# Patient Record
Sex: Female | Born: 2000 | Hispanic: No | Marital: Single | Smoking: Current some day smoker
Health system: Southern US, Community
[De-identification: ages and names within clinical notes are randomized; demographics above are authoritative.]

## PROBLEM LIST (undated history)

## (undated) DIAGNOSIS — Z8673 Personal history of transient ischemic attack (TIA), and cerebral infarction without residual deficits: Secondary | ICD-10-CM

## (undated) DIAGNOSIS — Z23 Encounter for immunization: Secondary | ICD-10-CM

## (undated) HISTORY — PX: NO PAST SURGERIES: SHX2092

## (undated) HISTORY — DX: Personal history of transient ischemic attack (TIA), and cerebral infarction without residual deficits: Z86.73

## (undated) HISTORY — DX: Encounter for immunization: Z23

---
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
## 2021-03-25 ENCOUNTER — Emergency Department (HOSPITAL_COMMUNITY)
Admission: EM | Admit: 2021-03-25 | Discharge: 2021-03-26 | Disposition: A | Discharge status: Home / Self Care | Payer: Managed Care, Other (non HMO) | Attending: Emergency Medicine | Admitting: Emergency Medicine

## 2021-03-25 DIAGNOSIS — Z5321 Procedure and treatment not carried out due to patient leaving prior to being seen by health care provider: Secondary | ICD-10-CM | POA: Insufficient documentation

## 2021-03-25 DIAGNOSIS — T50901A Poisoning by unspecified drugs, medicaments and biological substances, accidental (unintentional), initial encounter: Secondary | ICD-10-CM | POA: Insufficient documentation

## 2021-03-25 NOTE — ED Notes (Signed)
Patient called for triage x6

## 2021-03-27 ENCOUNTER — Other Ambulatory Visit: Payer: Self-pay

## 2021-03-27 ENCOUNTER — Emergency Department (HOSPITAL_COMMUNITY): Payer: Managed Care, Other (non HMO)

## 2021-03-27 ENCOUNTER — Encounter (HOSPITAL_COMMUNITY): Payer: Self-pay | Admitting: Emergency Medicine

## 2021-03-27 ENCOUNTER — Emergency Department (HOSPITAL_COMMUNITY)
Admission: EM | Admit: 2021-03-27 | Discharge: 2021-03-27 | Disposition: A | Discharge status: Home / Self Care | Payer: Managed Care, Other (non HMO) | Source: Home / Self Care | Attending: Emergency Medicine | Admitting: Emergency Medicine

## 2021-03-27 DIAGNOSIS — I676 Nonpyogenic thrombosis of intracranial venous system: Secondary | ICD-10-CM | POA: Insufficient documentation

## 2021-03-27 DIAGNOSIS — G08 Intracranial and intraspinal phlebitis and thrombophlebitis: Secondary | ICD-10-CM

## 2021-03-27 DIAGNOSIS — Z91040 Latex allergy status: Secondary | ICD-10-CM | POA: Insufficient documentation

## 2021-03-27 LAB — COMPREHENSIVE METABOLIC PANEL
ALT: 11 U/L (ref 0–44)
AST: 15 U/L (ref 15–41)
Albumin: 3.7 g/dL (ref 3.5–5.0)
Alkaline Phosphatase: 63 U/L (ref 38–126)
Anion gap: 9 (ref 5–15)
BUN: 6 mg/dL (ref 6–20)
CO2: 22 mmol/L (ref 22–32)
Calcium: 9.1 mg/dL (ref 8.9–10.3)
Chloride: 103 mmol/L (ref 98–111)
Creatinine, Ser: 0.82 mg/dL (ref 0.44–1.00)
GFR, Estimated: >60 mL/min (ref >60)
Glucose, Bld: 95 mg/dL (ref 70–99)
Potassium: 3.9 mmol/L (ref 3.5–5.1)
Sodium: 134 mmol/L — ABNORMAL LOW (ref 135–145)
Total Bilirubin: 0.8 mg/dL (ref 0.3–1.2)
Total Protein: 6.8 g/dL (ref 6.5–8.1)

## 2021-03-27 LAB — CBC WITH DIFFERENTIAL/PLATELET
Abs Immature Granulocytes: 0.02 10*3/uL (ref 0.00–0.07)
Basophils Absolute: 0 10*3/uL (ref 0.0–0.1)
Basophils Relative: 0 %
Eosinophils Absolute: 0 10*3/uL (ref 0.0–0.5)
Eosinophils Relative: 0 %
HCT: 32.9 % — ABNORMAL LOW (ref 36.0–46.0)
Hemoglobin: 9.3 g/dL — ABNORMAL LOW (ref 12.0–15.0)
Immature Granulocytes: 0 %
Lymphocytes Relative: 23 %
Lymphs Abs: 1.5 10*3/uL (ref 0.7–4.0)
MCH: 19.2 pg — ABNORMAL LOW (ref 26.0–34.0)
MCHC: 28.3 g/dL — ABNORMAL LOW (ref 30.0–36.0)
MCV: 68 fL — ABNORMAL LOW (ref 80.0–100.0)
Monocytes Absolute: 0.6 10*3/uL (ref 0.1–1.0)
Monocytes Relative: 9 %
Neutro Abs: 4.5 10*3/uL (ref 1.7–7.7)
Neutrophils Relative %: 68 %
Platelets: 242 10*3/uL (ref 150–400)
RBC: 4.84 MIL/uL (ref 3.87–5.11)
RDW: 17.6 % — ABNORMAL HIGH (ref 11.5–15.5)
WBC: 6.6 10*3/uL (ref 4.0–10.5)
nRBC: 0 % (ref 0.0–0.2)

## 2021-03-27 LAB — I-STAT BETA HCG BLOOD, ED (MC, WL, AP ONLY): I-stat hCG, quantitative: <5 m[IU]/mL (ref <5)

## 2021-03-27 MED ORDER — METOCLOPRAMIDE HCL 5 MG/ML IJ SOLN
10.0000 mg | Freq: Once | INTRAMUSCULAR | Status: AC
  Administered 2021-03-27: 10 mg via INTRAVENOUS
  Filled 2021-03-27: qty 2

## 2021-03-27 MED ORDER — ENOXAPARIN SODIUM 100 MG/ML IJ SOSY
100.0000 mg | PREFILLED_SYRINGE | Freq: Once | INTRAMUSCULAR | Status: AC
  Administered 2021-03-27: 100 mg via SUBCUTANEOUS
  Filled 2021-03-27: qty 1

## 2021-03-27 MED ORDER — SODIUM CHLORIDE 0.9 % IV BOLUS
1000.0000 mL | Freq: Once | INTRAVENOUS | Status: AC
  Administered 2021-03-27: 1000 mL via INTRAVENOUS

## 2021-03-27 MED ORDER — ENOXAPARIN SODIUM 40 MG/0.4ML IJ SOSY: 100.0000 mg | PREFILLED_SYRINGE | INTRAMUSCULAR | 0 refills | Status: DC

## 2021-03-27 MED ORDER — DABIGATRAN ETEXILATE MESYLATE 150 MG PO CAPS: 150.0000 mg | ORAL_CAPSULE | Freq: Two times a day (BID) | ORAL | 0 refills | Status: DC

## 2021-03-27 MED ORDER — DEXAMETHASONE SODIUM PHOSPHATE 10 MG/ML IJ SOLN
10.0000 mg | Freq: Once | INTRAMUSCULAR | Status: AC
  Administered 2021-03-27: 10 mg via INTRAVENOUS
  Filled 2021-03-27: qty 1

## 2021-03-27 MED ORDER — DIPHENHYDRAMINE HCL 50 MG/ML IJ SOLN
25.0000 mg | Freq: Once | INTRAMUSCULAR | Status: AC
  Administered 2021-03-27: 25 mg via INTRAVENOUS
  Filled 2021-03-27: qty 1

## 2021-03-27 MED ORDER — ENOXAPARIN SODIUM 100 MG/ML IJ SOSY: 100.0000 mg | PREFILLED_SYRINGE | INTRAMUSCULAR | 0 refills | Status: DC

## 2021-03-27 MED ORDER — IOHEXOL 350 MG/ML SOLN
75.0000 mL | Freq: Once | INTRAVENOUS | Status: AC | PRN
  Administered 2021-03-27: 75 mL via INTRAVENOUS

## 2021-03-27 MED ORDER — KETOROLAC TROMETHAMINE 15 MG/ML IJ SOLN
15.0000 mg | Freq: Once | INTRAMUSCULAR | Status: AC
  Administered 2021-03-27: 15 mg via INTRAVENOUS
  Filled 2021-03-27: qty 1

## 2021-03-27 NOTE — ED Provider Notes (Signed)
Care of the patient assumed at the change of shift. Discussed management with Dr. Amada Jupiter, Neurology who has reviewed her case. He recommends Lovenox x 5 days and then transition to oral Pradaxa BID thereafter. Patient is mildly anemic, she is unsure if this is new for her. She does not have any current bleeding but reports some days of her menstrual cycle are heavy. Discussed management, follow up and reasons to return with patient and mother at bedside. She will be given a dose of Lovenox in the ED prior to discharge.    Tammy Savoy, MD 03/27/21 1640

## 2021-03-27 NOTE — ED Triage Notes (Signed)
Patient coming from home, complaint of migraine for approx 3 weeks, states nothing makes it any better. VSS. NAD.

## 2021-03-27 NOTE — ED Provider Notes (Signed)
[facility] EMERGENCY DEPARTMENT Provider Note   CSN: [id number] Arrival date & time: 03/27/21  1011     History Chief Complaint  Patient presents with   Migraine    Tammy Weber is a 20 y.o. female.  HPI 20 year old female presents with headache for about 3 weeks.  Waxes and wanes but is always there.  It is a throbbing type headache on the right side.  Occasionally she has had left-sided symptoms.  She has had on and off blurry vision but mostly when she first wakes up.  Vomited once last week but otherwise no vomiting.  No focal weakness or numbness.  No fevers or neck stiffness though she is having a little bit of right-sided neck discomfort over her trapezius over the last couple days.  No dizziness or double vision.  Has been trying ibuprofen with no relief.  Discussed with doctor from [facility] who suggested she come to the ER for a CT scan.  Headache right now is about a 5.  At its worst is benign.  Past Medical History:  Diagnosis Date   Vaccine for human papilloma virus (HPV) types 6, 11, 16, and 18 administered     There are no problems to display for this patient.   Past Surgical History:  Procedure Laterality Date   NO PAST SURGERIES       OB History     Gravida  0   Para  0   Term  0   Preterm  0   AB  0   Living  0      SAB  0   IAB  0   Ectopic  0   Multiple  0   Live Births  0           Family History  Problem Relation Age of Onset   Colon cancer Paternal Aunt    Ovarian cancer Other     Social History   Tobacco Use   Smoking status: Never   Smokeless tobacco: Never  Vaping Use   Vaping Use: Never used  Substance Use Topics   Alcohol use: Yes   Drug use: No    Home Medications Prior to Admission medications   Medication Sig Start Date End Date Taking? Authorizing Provider  Norethindrone Acetate-Ethinyl Estrad-FE (JUNEL FE 24) 1-20 MG-MCG(24) tablet Take 1 tablet by mouth daily. 06/15/20    Copland, Ilona Sorrel, PA-C    Allergies    Latex  Review of Systems   Review of Systems  Constitutional:  Negative for fever.  Eyes:  Positive for photophobia and visual disturbance.  Gastrointestinal:  Positive for vomiting.  Musculoskeletal:  Positive for neck pain. Negative for neck stiffness.  Neurological:  Positive for headaches. Negative for dizziness, weakness and numbness.  All other systems reviewed and are negative.  Physical Exam Updated Vital Signs BP (!) 148/88   Pulse 96   Temp 98.4 F (36.9 C) (Oral)   Resp 20   SpO2 100%   Physical Exam Vitals and nursing note reviewed.  Constitutional:      General: She is not in acute distress.    Appearance: She is well-developed. She is not ill-appearing or diaphoretic.  HENT:     Head: Normocephalic and atraumatic.     Right Ear: External ear normal.     Left Ear: External ear normal.     Nose: Nose normal.  Eyes:     General:  Right eye: No discharge.        Left eye: No discharge.     Extraocular Movements: Extraocular movements intact.     Pupils: Pupils are equal, round, and reactive to light.  Neck:   Cardiovascular:     Rate and Rhythm: Normal rate and regular rhythm.     Heart sounds: Normal heart sounds.  Pulmonary:     Effort: Pulmonary effort is normal.     Breath sounds: Normal breath sounds.  Abdominal:     Palpations: Abdomen is soft.     Tenderness: There is no abdominal tenderness.  Musculoskeletal:     Cervical back: Normal range of motion and neck supple. No rigidity.  Skin:    General: Skin is warm and dry.  Neurological:     Mental Status: She is alert.     Comments: CN 3-12 grossly intact. 5/5 strength in all 4 extremities. Grossly normal sensation. Normal finger to nose.   Psychiatric:        Mood and Affect: Mood is not anxious.    ED Results / Procedures / Treatments   Labs (all labs ordered are listed, but only abnormal results are displayed) Labs Reviewed   COMPREHENSIVE METABOLIC PANEL - Abnormal; Notable for the following components:      Result Value   Sodium 134 (*)    All other components within normal limits  CBC WITH DIFFERENTIAL/PLATELET - Abnormal; Notable for the following components:   Hemoglobin 9.3 (*)    HCT 32.9 (*)    MCV 68.0 (*)    MCH 19.2 (*)    MCHC 28.3 (*)    RDW 17.6 (*)    All other components within normal limits  I-STAT BETA HCG BLOOD, ED ([facility], [facility], [facility] ONLY)    EKG None  Radiology CT VENOGRAM HEAD  Result Date: 03/27/2021 CLINICAL DATA:  Dural venous sinus thrombosis suspected EXAM: CT VENOGRAM HEAD TECHNIQUE: Venographic phase images of the brain were obtained following the administration of intravenous contrast. Multiplanar reformats and maximum intensity projections were generated. CONTRAST:  27mL OMNIPAQUE IOHEXOL 350 MG/ML SOLN COMPARISON:  None. FINDINGS: Brain: There is no evidence of acute intracranial hemorrhage, extra-axial fluid collection, or acute infarct. The ventricles are normal in size. There is no mass lesion. There is no midline shift. Vascular: There is abnormal density in the right transverse sinus on the noncontrast images with occlusive filling defect in the transverse sinus extending to the sigmoid sinus and jugular bulb on the venous phase post-contrast images. The left transverse and sigmoid sinuses are patent. The superior and inferior sagittal sinuses are patent. The internal cerebral veins and basal veins of Rosenthal are patent. The major intracranial arteries are patent. Skull: Normal. Negative for fracture or focal lesion. Sinuses/Orbits: The imaged paranasal sinuses are clear. The imaged globes and orbits are unremarkable. Other: None. 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 These results were called by telephone at the time of interpretation on 03/27/2021 at 2:49 pm to provider Pricilla Loveless , who verbally acknowledged these results.  Electronically Signed   By: Lesia Hausen M.D.   On: 03/27/2021 14:53    Procedures Procedures   Medications Ordered in ED Medications  sodium chloride 0.9 % bolus 1,000 mL (1,000 mLs Intravenous New Bag/Given 03/27/21 1414)  metoCLOPramide (REGLAN) injection 10 mg (10 mg Intravenous Given 03/27/21 1414)  diphenhydrAMINE (BENADRYL) injection 25 mg (25 mg Intravenous Given 03/27/21 1414)  ketorolac (TORADOL) 15 MG/ML injection 15  mg (15 mg Intravenous Given 03/27/21 1414)  dexamethasone (DECADRON) injection 10 mg (10 mg Intravenous Given 03/27/21 1413)  iohexol (OMNIPAQUE) 350 MG/ML injection 75 mL (75 mLs Intravenous Contrast Given 03/27/21 1441)    ED Course  I have reviewed the triage vital signs and the nursing notes.  Pertinent labs & imaging results that were available during my care of the patient were reviewed by me and considered in my medical decision making (see chart for details).    MD[facility] Rules/Calculators/A&P                           Given unilateral pain, vague eye symptoms, and her being on birth control, CTV obtained to rule out venous thrombosis.  This is found to be positive which would explain her symptoms.  Her headache is gone after treatment in the ED.  I discussed with Dr. Amada Jupiter, and he is not quite sure right now if she should be in the hospital versus home with anticoagulation.  He will consult and make a formal recommendation.  Care to Dr. Bernette Mayers.  Patient made aware of these findings. Final Clinical Impression(s) / ED Diagnoses Final diagnoses:  Cerebral venous thrombosis    Rx / DC Orders ED Discharge Orders     None        Pricilla Loveless, MD 03/27/21 1539

## 2021-03-29 ENCOUNTER — Inpatient Hospital Stay (HOSPITAL_COMMUNITY)
Admission: EM | Admit: 2021-03-29 | Discharge: 2021-03-31 | DRG: 093 | Disposition: A | Discharge status: Home / Self Care | Payer: Managed Care, Other (non HMO) | Attending: Family Medicine | Admitting: Family Medicine

## 2021-03-29 ENCOUNTER — Encounter (HOSPITAL_COMMUNITY): Payer: Self-pay | Admitting: Emergency Medicine

## 2021-03-29 ENCOUNTER — Emergency Department (HOSPITAL_COMMUNITY): Payer: Managed Care, Other (non HMO)

## 2021-03-29 ENCOUNTER — Other Ambulatory Visit: Payer: Self-pay

## 2021-03-29 DIAGNOSIS — G08 Intracranial and intraspinal phlebitis and thrombophlebitis: Secondary | ICD-10-CM

## 2021-03-29 DIAGNOSIS — I676 Nonpyogenic thrombosis of intracranial venous system: Principal | ICD-10-CM | POA: Diagnosis present

## 2021-03-29 DIAGNOSIS — H918X3 Other specified hearing loss, bilateral: Secondary | ICD-10-CM | POA: Diagnosis present

## 2021-03-29 DIAGNOSIS — D509 Iron deficiency anemia, unspecified: Secondary | ICD-10-CM | POA: Diagnosis present

## 2021-03-29 DIAGNOSIS — R519 Headache, unspecified: Secondary | ICD-10-CM | POA: Diagnosis present

## 2021-03-29 DIAGNOSIS — D649 Anemia, unspecified: Secondary | ICD-10-CM | POA: Diagnosis present

## 2021-03-29 DIAGNOSIS — T384X5A Adverse effect of oral contraceptives, initial encounter: Secondary | ICD-10-CM | POA: Diagnosis present

## 2021-03-29 DIAGNOSIS — H538 Other visual disturbances: Secondary | ICD-10-CM | POA: Diagnosis present

## 2021-03-29 DIAGNOSIS — Z20822 Contact with and (suspected) exposure to covid-19: Secondary | ICD-10-CM | POA: Diagnosis present

## 2021-03-29 DIAGNOSIS — E538 Deficiency of other specified B group vitamins: Secondary | ICD-10-CM | POA: Diagnosis present

## 2021-03-29 LAB — COMPREHENSIVE METABOLIC PANEL
ALT: 12 U/L (ref 0–44)
AST: 16 U/L (ref 15–41)
Albumin: 3.9 g/dL (ref 3.5–5.0)
Alkaline Phosphatase: 58 U/L (ref 38–126)
Anion gap: 10 (ref 5–15)
BUN: 7 mg/dL (ref 6–20)
CO2: 24 mmol/L (ref 22–32)
Calcium: 9.5 mg/dL (ref 8.9–10.3)
Chloride: 104 mmol/L (ref 98–111)
Creatinine, Ser: 0.91 mg/dL (ref 0.44–1.00)
GFR, Estimated: >60 mL/min (ref >60)
Glucose, Bld: 99 mg/dL (ref 70–99)
Potassium: 4.1 mmol/L (ref 3.5–5.1)
Sodium: 138 mmol/L (ref 135–145)
Total Bilirubin: 0.2 mg/dL — ABNORMAL LOW (ref 0.3–1.2)
Total Protein: 7.5 g/dL (ref 6.5–8.1)

## 2021-03-29 LAB — CBC WITH DIFFERENTIAL/PLATELET
Abs Immature Granulocytes: 0 10*3/uL (ref 0.00–0.07)
Basophils Absolute: 0.1 10*3/uL (ref 0.0–0.1)
Basophils Relative: 1 %
Eosinophils Absolute: 0.1 10*3/uL (ref 0.0–0.5)
Eosinophils Relative: 1 %
HCT: 35.3 % — ABNORMAL LOW (ref 36.0–46.0)
Hemoglobin: 9.8 g/dL — ABNORMAL LOW (ref 12.0–15.0)
Lymphocytes Relative: 38 %
Lymphs Abs: 2.7 10*3/uL (ref 0.7–4.0)
MCH: 19.2 pg — ABNORMAL LOW (ref 26.0–34.0)
MCHC: 27.8 g/dL — ABNORMAL LOW (ref 30.0–36.0)
MCV: 69.2 fL — ABNORMAL LOW (ref 80.0–100.0)
Monocytes Absolute: 0.1 10*3/uL (ref 0.1–1.0)
Monocytes Relative: 2 %
Neutro Abs: 4.2 10*3/uL (ref 1.7–7.7)
Neutrophils Relative %: 58 %
Platelets: 298 10*3/uL (ref 150–400)
RBC: 5.1 MIL/uL (ref 3.87–5.11)
RDW: 18.3 % — ABNORMAL HIGH (ref 11.5–15.5)
WBC: 7.2 10*3/uL (ref 4.0–10.5)
nRBC: 0 % (ref 0.0–0.2)
nRBC: 0 /100 WBC

## 2021-03-29 LAB — HEPARIN ANTI-XA: Heparin LMW: 1.07 IU/mL

## 2021-03-29 IMAGING — MR MR MRV HEAD WO/W CM
4 of 5 series · 33 of 48 positions shown · IV contrast (Gadavist)
Comparison: Same day CTV
COMPARISON: Same day CTV

Addendum:
CLINICAL DATA: Headache, new or worsening, neuro deficit (Age
19-49y)

EXAM:
MRI HEAD WITHOUT AND WITH CONTRAST
MRV HEAD WITHOUT AND WITH CONTRAST
TECHNIQUE: Multiplanar, multi-echo pulse sequences of the brain and surrounding
structures were acquired without and with intravenous contrast.
Angiographic images of the intracranial venous structures were
acquired using MRV technique without and with intravenous contrast.
CONTRAST:  CONTRAST
7.5 ML OF GADAVIST IV.

[Series 5: venous inhance coronal · coronal · portal-venous · 0.9mm · 0.57mm/px · 9 of 192 slices shown]
[im 1/192]
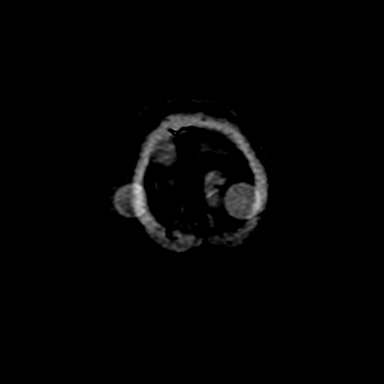
[im 32/192]
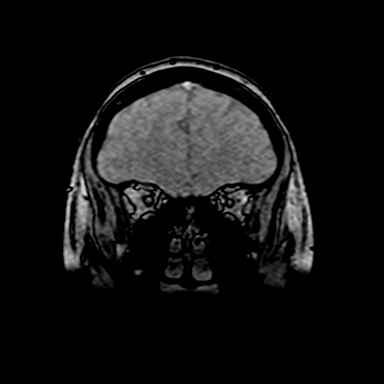
[im 64/192]
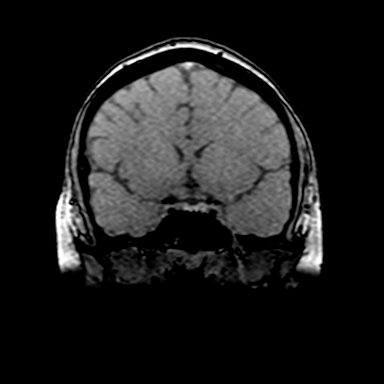
[im 80/192]
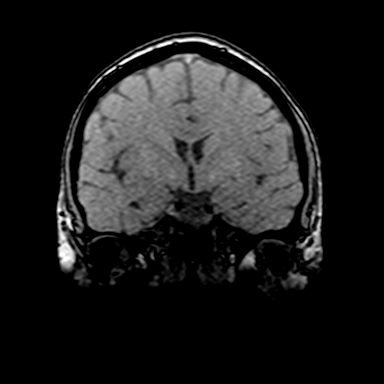
[im 96/192]
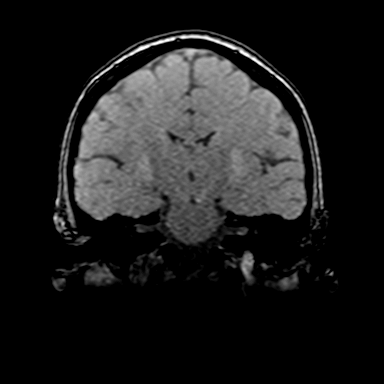
[im 112/192]
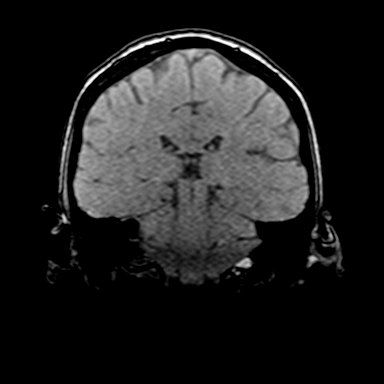
[im 128/192]
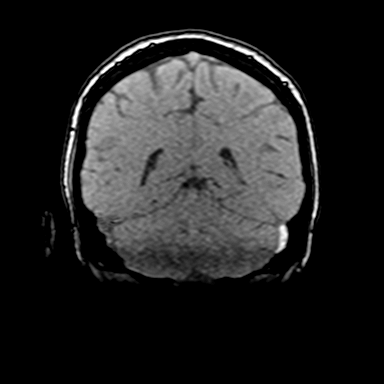
[im 160/192]
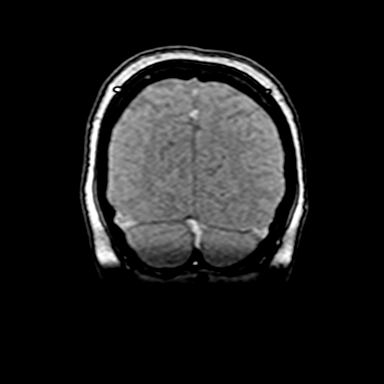
[im 192/192]
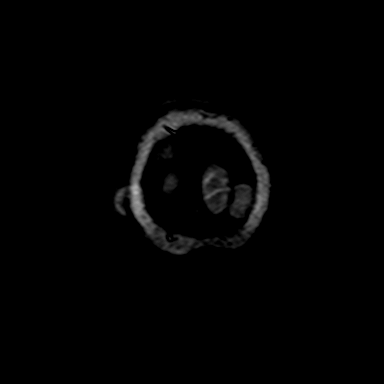

[Series 6: venous inhance coronal_msum · coronal · portal-venous · 0.9mm · 0.57mm/px · 9 of 192 slices shown]
[im 1/192]
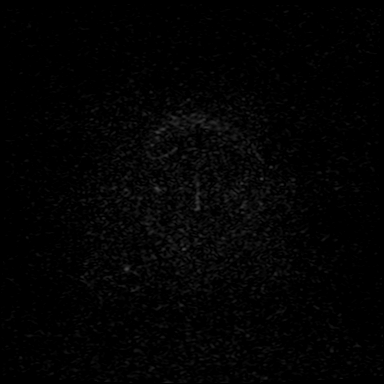
[im 32/192]
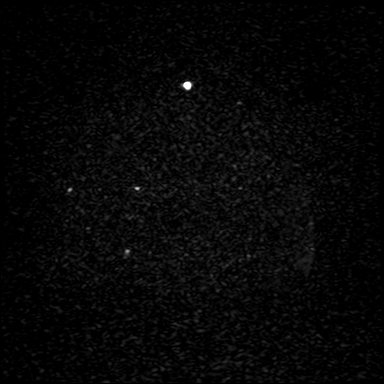
[im 64/192]
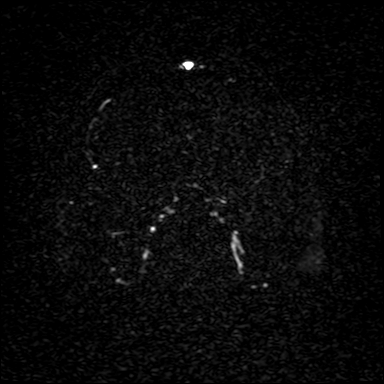
[im 80/192]
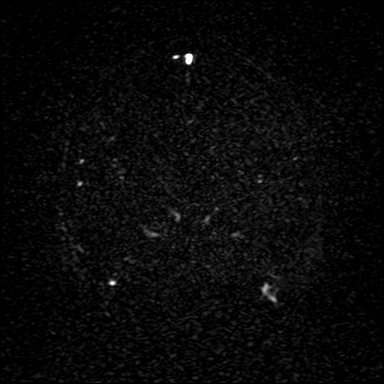
[im 96/192]
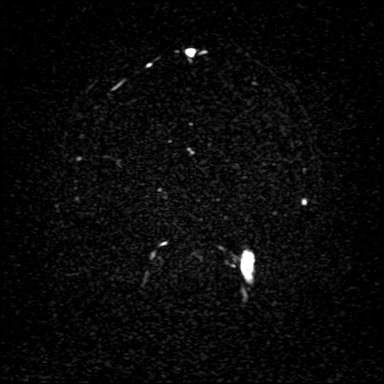
[im 112/192]
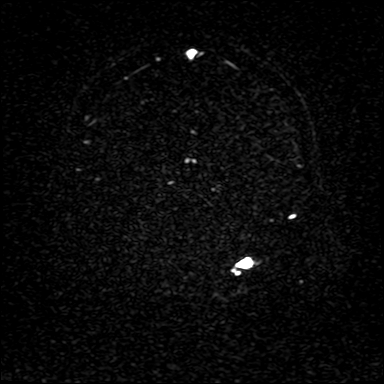
[im 128/192]
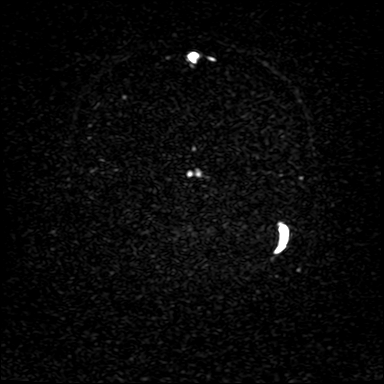
[im 160/192]
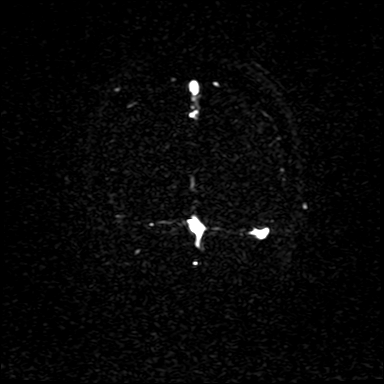
[im 192/192]
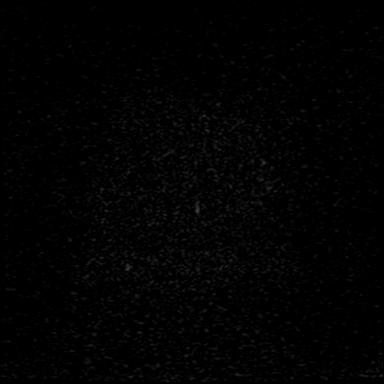

[Series 9: t1_fl3d_sag_p2_iso · sagittal · 1.0mm · 0.98mm/px · 11 of 160 slices shown]
[im 1/160]
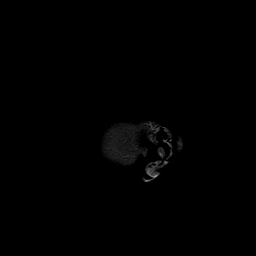
[im 16/160]
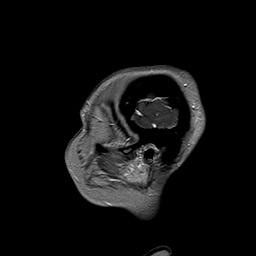
[im 32/160]
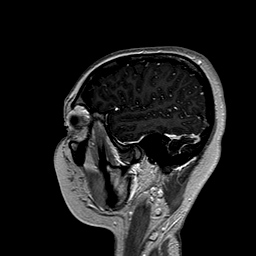
[im 48/160]
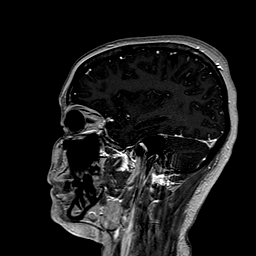
[im 64/160]
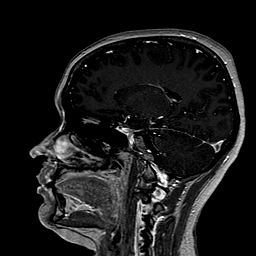
[im 80/160]
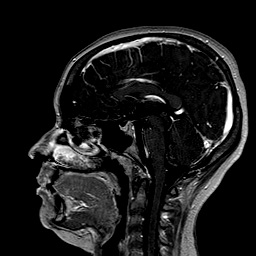
[im 96/160]
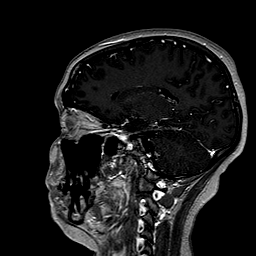
[im 112/160]
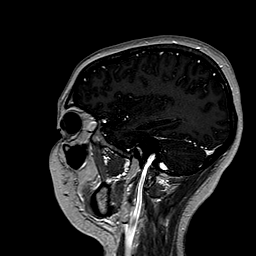
[im 128/160]
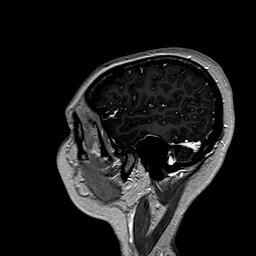
[im 144/160]
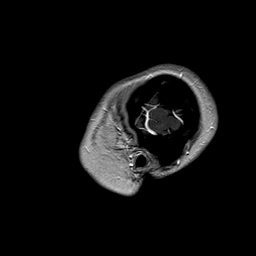
[im 160/160]
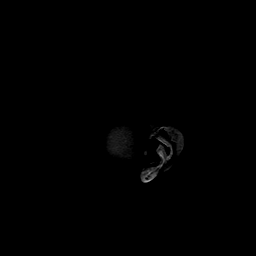

[Series 10: t1_fl3d_sag_p2_iso_mpr_ axial · axial · 2.0mm · 0.45mm/px · z∈[-98,+53]mm · 4 of 77 slices shown]
[im 1/77]
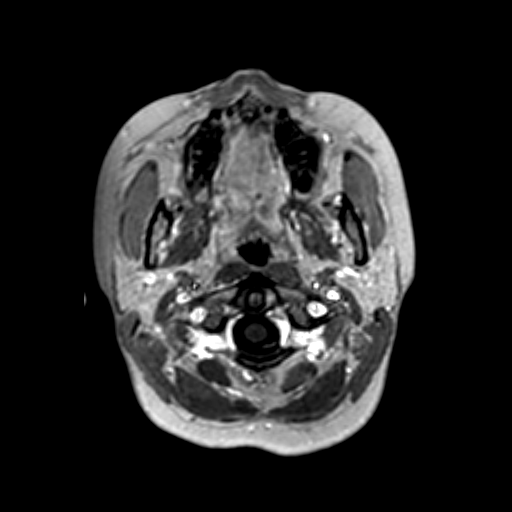
[im 20/77]
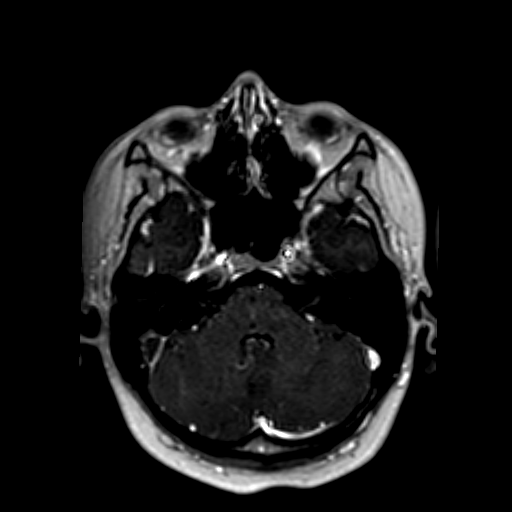
[im 39/77]
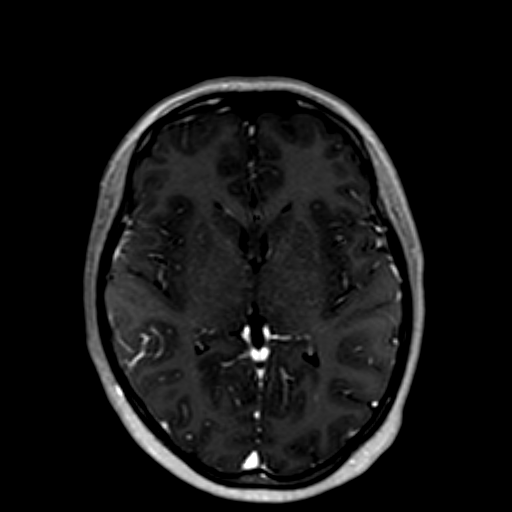
[im 77/77]
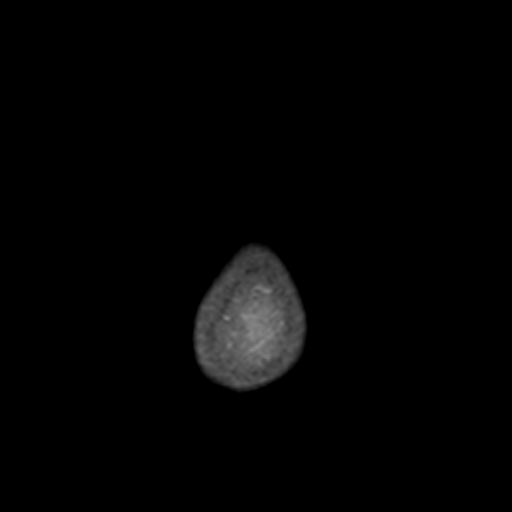

[33 of 48 positions shown; findings below may reference images not displayed]

FINDINGS: MRI HEAD WITHOUT CONTRAST

Brain: No acute infarction, hemorrhage, hydrocephalus, extra-axial
collection or mass lesion. No brain edema. No abnormal enhancement.

Vascular: Major arterial flow voids are maintained at the skull
base. Dural venous sinuses are assess below.

Skull and upper cervical spine: Normal marrow signal.

Sinuses/Orbits: Clear paranasal sinuses.  Unremarkable orbits.

Other: Small right mastoid effusion.

MR VENOGRAM WITHOUT CONTRAST

Positive for occlusive thrombus within the right transverse and
sigmoid sinuses. Thrombus extends into the right jugular bulb and
upper internal jugular vein. Some thrombus extends into adjacent
draining cortical veins. The superior sagittal sinus, left
transverse sinus, left sigmoid sinus and left jugular bulb are
patent. The straight sinus and visualized deep cerebral veins are
patent. Symmetric opacification of the cavernous sinuses.
IMPRESSION: 1. Positive for occlusive thrombus within the right transverse and
sigmoid sinuses. Thrombus extends into the right jugular bulb and
upper internal jugular vein. No evidence of brain edema or venous
infarct.
2. Small right mastoid effusion.

ADDENDUM:
Correction to the comparison: The prior CTV was on [DATE], not
the same day. While comparison across modalities is difficult, the
extent of thrombus may be slightly progressed in comparison to that
study.

Findings in the report and this addendum discussed with Dr. SAN NICOLAS via
telephone at [DATE] p.m.

*** End of Addendum ***
FINDINGS: MRI HEAD WITHOUT CONTRAST

Brain: No acute infarction, hemorrhage, hydrocephalus, extra-axial
collection or mass lesion. No brain edema. No abnormal enhancement.

Vascular: Major arterial flow voids are maintained at the skull
base. Dural venous sinuses are assess below.

Skull and upper cervical spine: Normal marrow signal.

Sinuses/Orbits: Clear paranasal sinuses.  Unremarkable orbits.

Other: Small right mastoid effusion.

MR VENOGRAM WITHOUT CONTRAST

Positive for occlusive thrombus within the right transverse and
sigmoid sinuses. Thrombus extends into the right jugular bulb and
upper internal jugular vein. Some thrombus extends into adjacent
draining cortical veins. The superior sagittal sinus, left
transverse sinus, left sigmoid sinus and left jugular bulb are
patent. The straight sinus and visualized deep cerebral veins are
patent. Symmetric opacification of the cavernous sinuses.
IMPRESSION: 1. Positive for occlusive thrombus within the right transverse and
sigmoid sinuses. Thrombus extends into the right jugular bulb and
upper internal jugular vein. No evidence of brain edema or venous
infarct.
2. Small right mastoid effusion.

## 2021-03-29 IMAGING — MR MR MRV HEAD WO/W CM
1 series · 48 of 48 positions shown · IV contrast (gadavist)
Comparison: Same day CTV
COMPARISON: Same day CTV

Addendum:
CLINICAL DATA: Headache, new or worsening, neuro deficit (Age
19-49y)

EXAM:
MRI HEAD WITHOUT AND WITH CONTRAST
MRV HEAD WITHOUT AND WITH CONTRAST
TECHNIQUE: Multiplanar, multi-echo pulse sequences of the brain and surrounding
structures were acquired without and with intravenous contrast.
Angiographic images of the intracranial venous structures were
acquired using MRV technique without and with intravenous contrast.
CONTRAST:  CONTRAST
7.5 ML OF GADAVIST IV.

[Series 18: tof_fl2d_paracor · coronal · 2.0mm · 0.98mm/px · 48 of 127 slices shown]
[im 1/127]
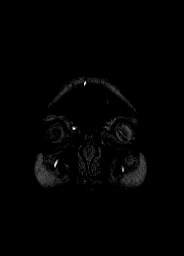
[im 3/127]
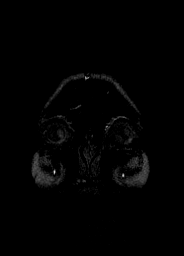
[im 6/127]
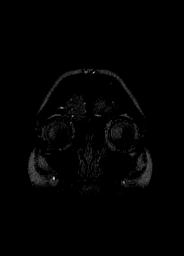
[im 9/127]
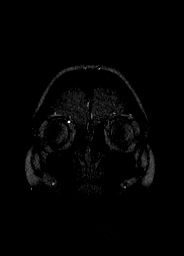
[im 11/127]
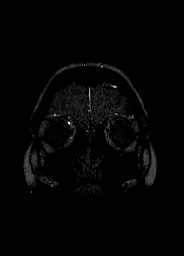
[im 14/127]
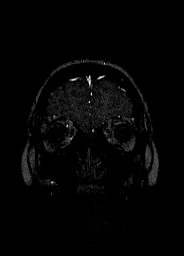
[im 17/127]
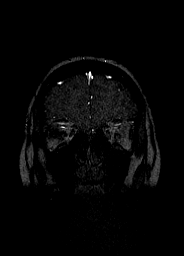
[im 19/127]
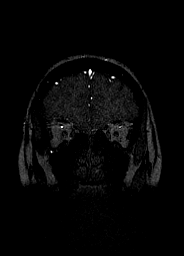
[im 22/127]
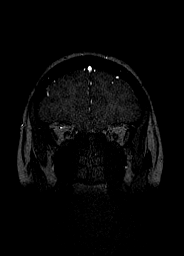
[im 25/127]
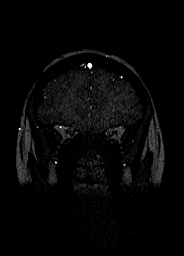
[im 27/127]
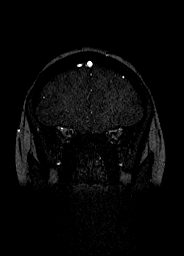
[im 30/127]
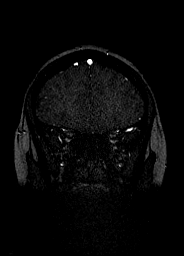
[im 33/127]
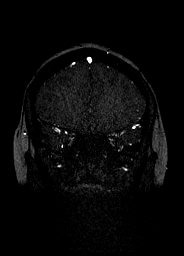
[im 35/127]
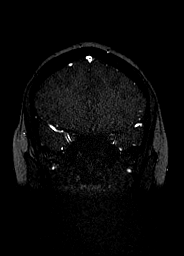
[im 38/127]
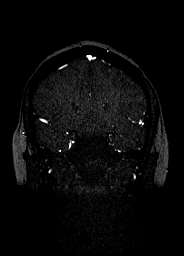
[im 41/127]
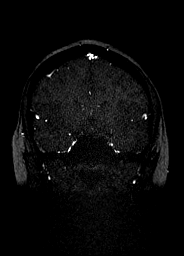
[im 43/127]
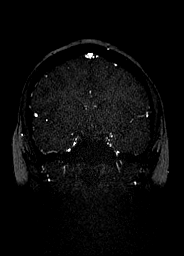
[im 46/127]
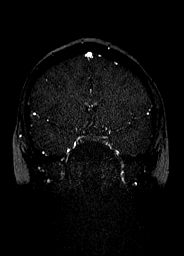
[im 49/127]
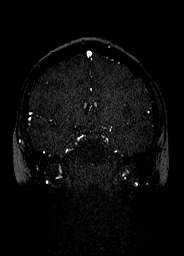
[im 51/127]
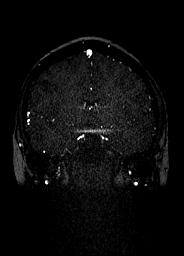
[im 54/127]
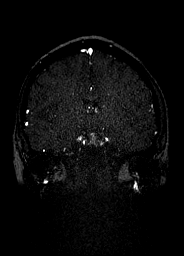
[im 57/127]
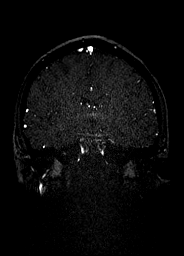
[im 59/127]
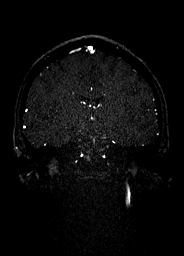
[im 62/127]
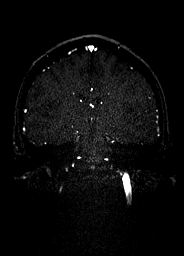
[im 65/127]
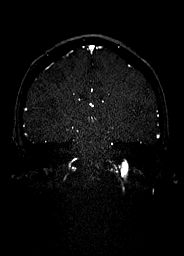
[im 68/127]
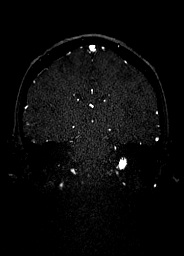
[im 70/127]
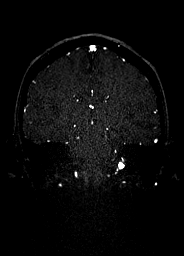
[im 73/127]
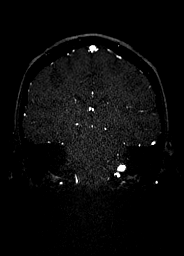
[im 76/127]
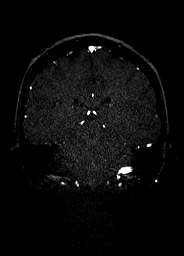
[im 78/127]
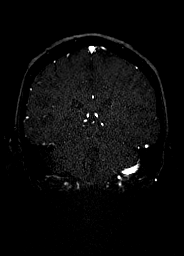
[im 81/127]
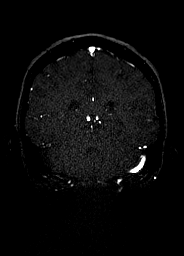
[im 84/127]
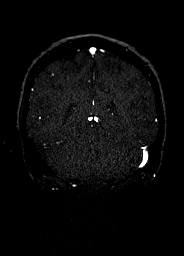
[im 86/127]
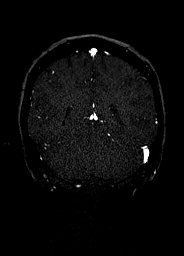
[im 89/127]
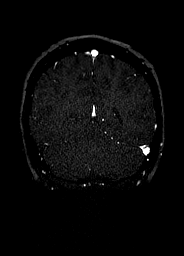
[im 92/127]
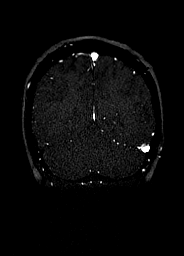
[im 94/127]
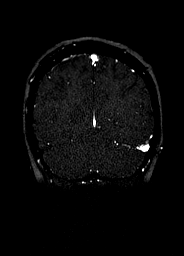
[im 97/127]
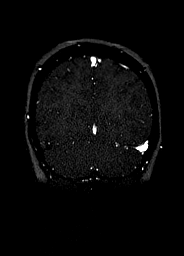
[im 100/127]
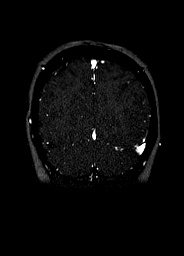
[im 102/127]
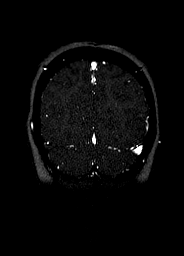
[im 105/127]
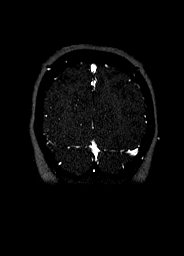
[im 108/127]
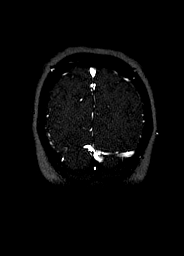
[im 110/127]
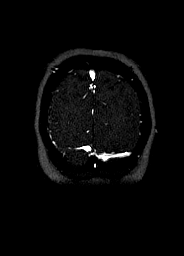
[im 113/127]
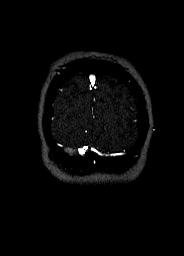
[im 116/127]
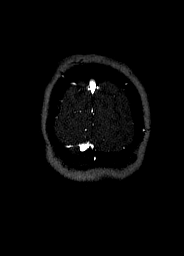
[im 118/127]
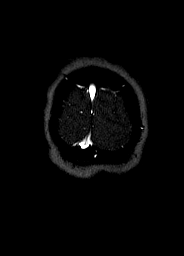
[im 121/127]
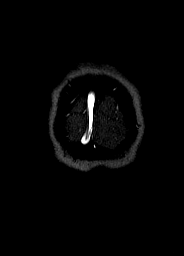
[im 124/127]
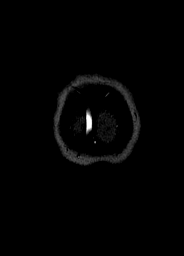
[im 127/127]
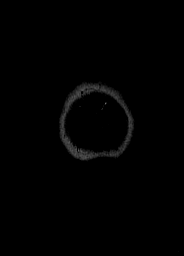

[48 of 48 positions shown; findings below may reference images not displayed]

FINDINGS: MRI HEAD WITHOUT CONTRAST

Brain: No acute infarction, hemorrhage, hydrocephalus, extra-axial
collection or mass lesion. No brain edema. No abnormal enhancement.

Vascular: Major arterial flow voids are maintained at the skull
base. Dural venous sinuses are assess below.

Skull and upper cervical spine: Normal marrow signal.

Sinuses/Orbits: Clear paranasal sinuses.  Unremarkable orbits.

Other: Small right mastoid effusion.

MR VENOGRAM WITHOUT CONTRAST

Positive for occlusive thrombus within the right transverse and
sigmoid sinuses. Thrombus extends into the right jugular bulb and
upper internal jugular vein. Some thrombus extends into adjacent
draining cortical veins. The superior sagittal sinus, left
transverse sinus, left sigmoid sinus and left jugular bulb are
patent. The straight sinus and visualized deep cerebral veins are
patent. Symmetric opacification of the cavernous sinuses.
IMPRESSION: 1. Positive for occlusive thrombus within the right transverse and
sigmoid sinuses. Thrombus extends into the right jugular bulb and
upper internal jugular vein. No evidence of brain edema or venous
infarct.
2. Small right mastoid effusion.

ADDENDUM:
Correction to the comparison: The prior CTV was on [DATE], not
the same day. While comparison across modalities is difficult, the
extent of thrombus may be slightly progressed in comparison to that
study.

Findings in the report and this addendum discussed with Dr. SAN NICOLAS via
telephone at [DATE] p.m.

*** End of Addendum ***
FINDINGS: MRI HEAD WITHOUT CONTRAST

Brain: No acute infarction, hemorrhage, hydrocephalus, extra-axial
collection or mass lesion. No brain edema. No abnormal enhancement.

Vascular: Major arterial flow voids are maintained at the skull
base. Dural venous sinuses are assess below.

Skull and upper cervical spine: Normal marrow signal.

Sinuses/Orbits: Clear paranasal sinuses.  Unremarkable orbits.

Other: Small right mastoid effusion.

MR VENOGRAM WITHOUT CONTRAST

Positive for occlusive thrombus within the right transverse and
sigmoid sinuses. Thrombus extends into the right jugular bulb and
upper internal jugular vein. Some thrombus extends into adjacent
draining cortical veins. The superior sagittal sinus, left
transverse sinus, left sigmoid sinus and left jugular bulb are
patent. The straight sinus and visualized deep cerebral veins are
patent. Symmetric opacification of the cavernous sinuses.
IMPRESSION: 1. Positive for occlusive thrombus within the right transverse and
sigmoid sinuses. Thrombus extends into the right jugular bulb and
upper internal jugular vein. No evidence of brain edema or venous
infarct.
2. Small right mastoid effusion.

## 2021-03-29 IMAGING — MR MR HEAD W/O CM
12 of 13 series · 44 of 48 positions shown · IV contrast (gadavist)
Comparison: Same day CTV
COMPARISON: Same day CTV

Addendum:
CLINICAL DATA: Headache, new or worsening, neuro deficit (Age
19-49y)

EXAM:
MRI HEAD WITHOUT AND WITH CONTRAST
MRV HEAD WITHOUT AND WITH CONTRAST
TECHNIQUE: Multiplanar, multi-echo pulse sequences of the brain and surrounding
structures were acquired without and with intravenous contrast.
Angiographic images of the intracranial venous structures were
acquired using MRV technique without and with intravenous contrast.
CONTRAST:  CONTRAST
7.5 ML OF GADAVIST IV.

[Series 5: DWI · axial · 3.0mm · 0.88mm/px · z∈[-107,+41]mm · 8 of 102 slices shown (1 of 4)]
[im 1/102]
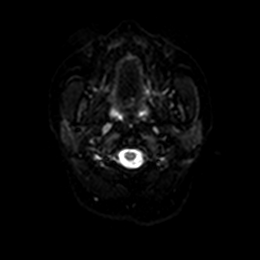
[im 15/102]
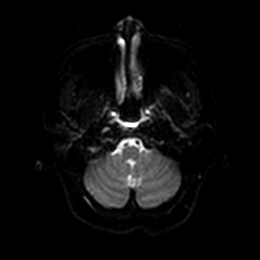
[im 29/102]
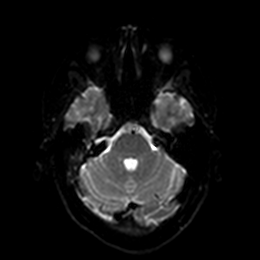
[im 44/102]
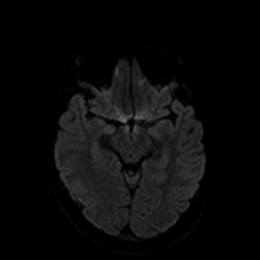
[im 58/102]
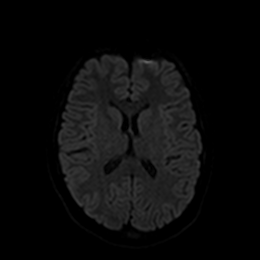
[im 73/102]
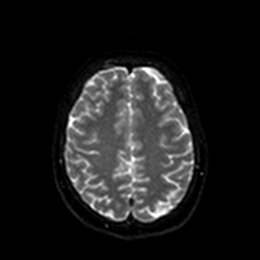
[im 87/102]
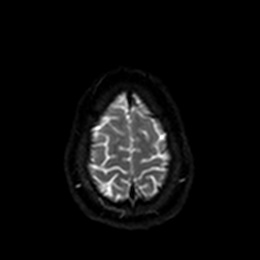
[im 102/102]
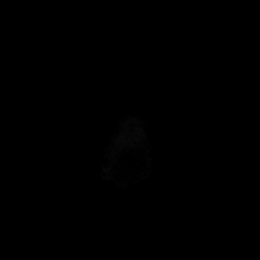

[Series 6: DWI · axial · 3.0mm · 0.88mm/px · z∈[-107,+41]mm · 4 of 51 slices shown (2 of 4)]
[im 1/51]
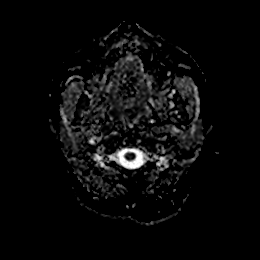
[im 17/51]
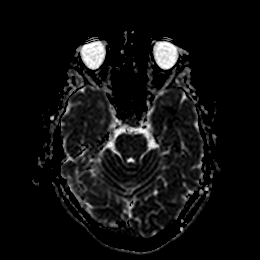
[im 34/51]
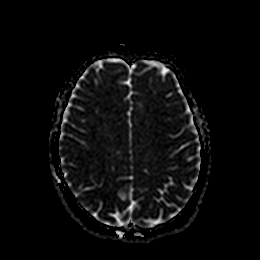
[im 51/51]
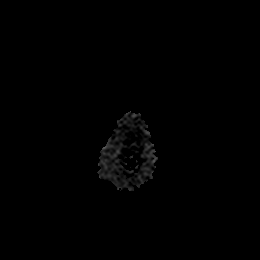

[Series 7: DWI · coronal · 4.0mm · 0.88mm/px · 5 of 72 slices shown (3 of 4)]
[im 1/72]
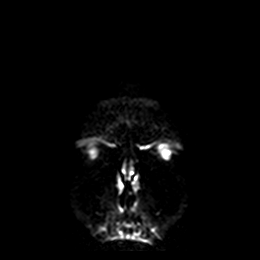
[im 18/72]
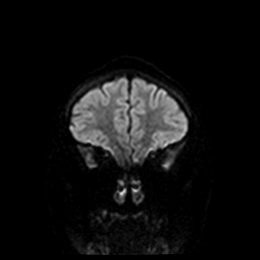
[im 36/72]
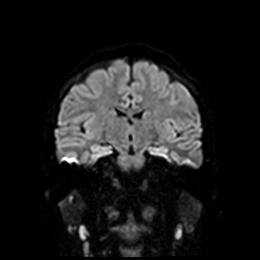
[im 54/72]
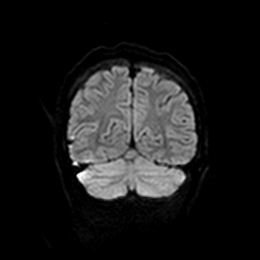
[im 72/72]
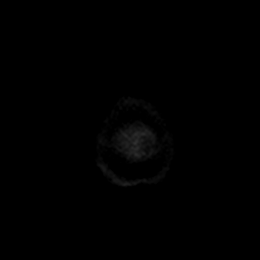

[Series 8: DWI · coronal · 4.0mm · 0.88mm/px · 3 of 36 slices shown (4 of 4)]
[im 1/36]
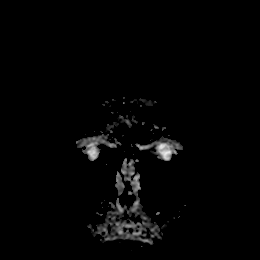
[im 18/36]
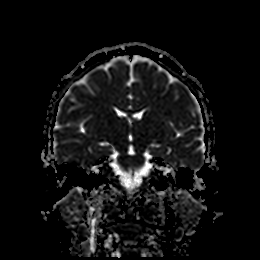
[im 36/36]
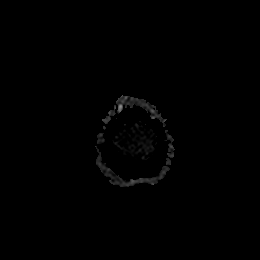

[Series 9: T1 · sagittal · 5.0mm · 0.75mm/px · 2 of 23 slices shown]
[im 1/23]
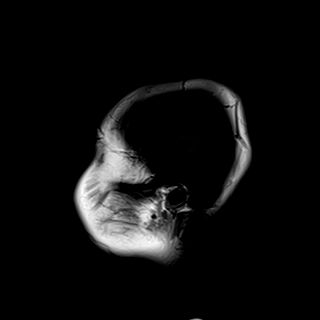
[im 23/23]
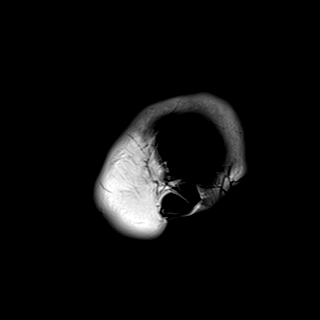

[Series 10: T2 · axial · 5.0mm · 0.72mm/px · z∈[-104,+38]mm · 2 of 25 slices shown (1 of 2)]
[im 1/25]
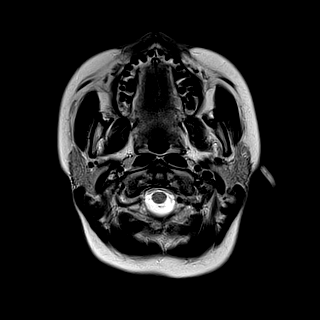
[im 25/25]
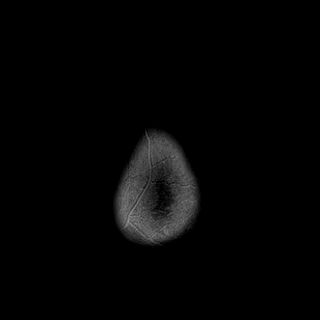

[Series 11: FLAIR · axial · 5.0mm · 0.45mm/px · z∈[-104,+38]mm · 2 of 25 slices shown]
[im 1/25]
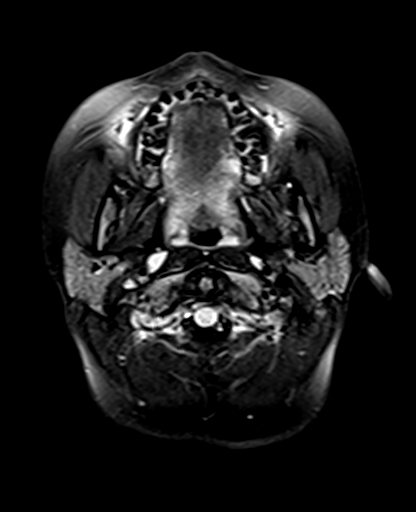
[im 25/25]
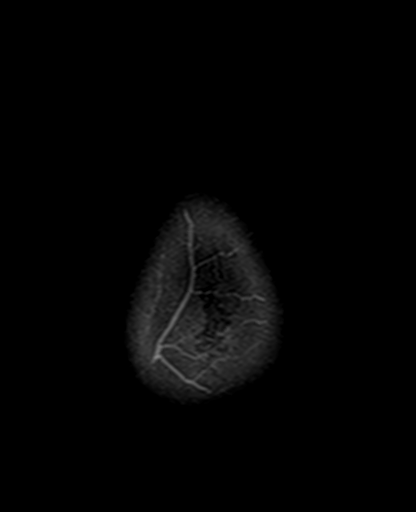

[Series 12: mag_images · axial · 3.0mm · 0.90mm/px · z∈[-114,+48]mm · 4 of 56 slices shown]
[im 1/56]
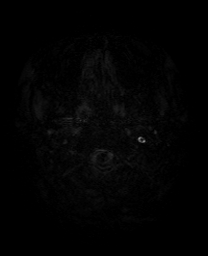
[im 19/56]
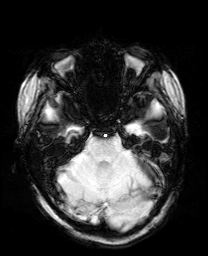
[im 37/56]
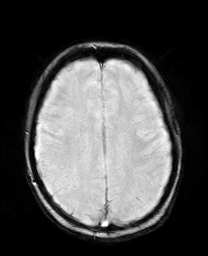
[im 56/56]
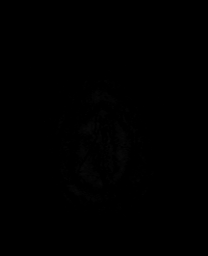

[Series 13: pha_images · axial · 3.0mm · 0.90mm/px · z∈[-114,+48]mm · 4 of 56 slices shown]
[im 1/56]
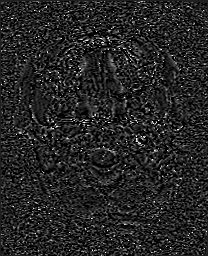
[im 19/56]
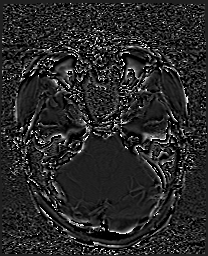
[im 37/56]
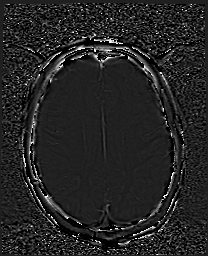
[im 56/56]
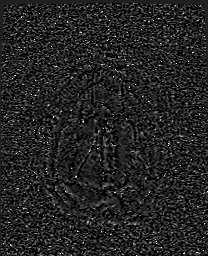

[Series 14: swi_images · axial · 3.0mm · 0.90mm/px · z∈[-114,+48]mm · 4 of 56 slices shown]
[im 1/56]
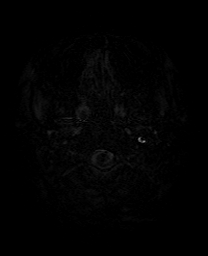
[im 19/56]
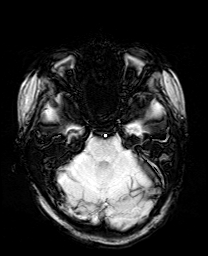
[im 37/56]
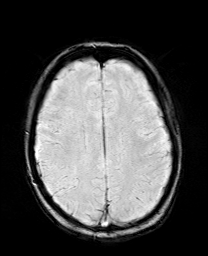
[im 56/56]
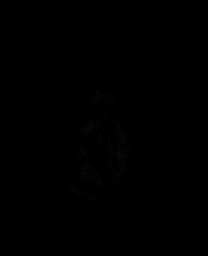

[Series 15: mip_images(sw) · axial · 24.0mm · 0.90mm/px · z∈[-104,+38]mm · 4 of 49 slices shown]
[im 1/49]
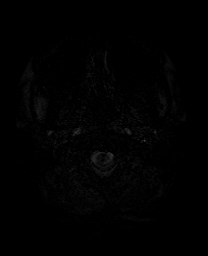
[im 17/49]
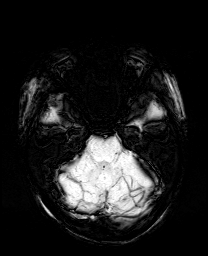
[im 33/49]
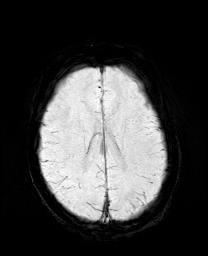
[im 49/49]
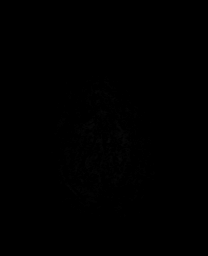

[Series 17: T2 · coronal · 5.0mm · 0.34mm/px · 2 of 29 slices shown (2 of 2)]
[im 1/29]
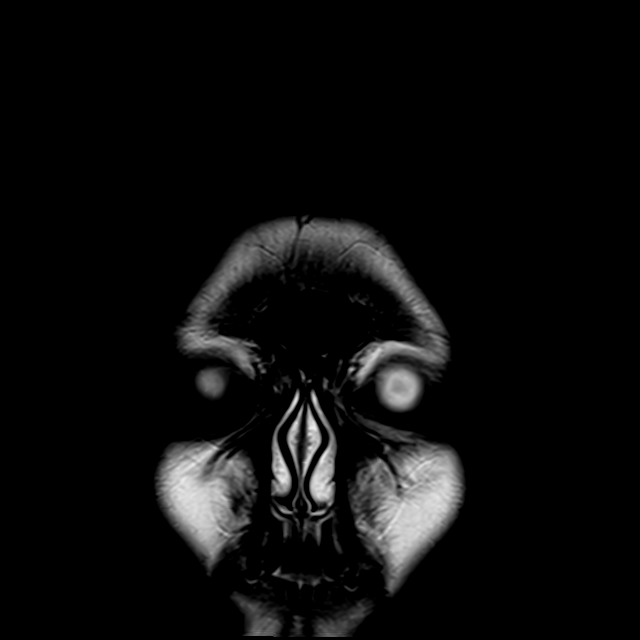
[im 29/29]
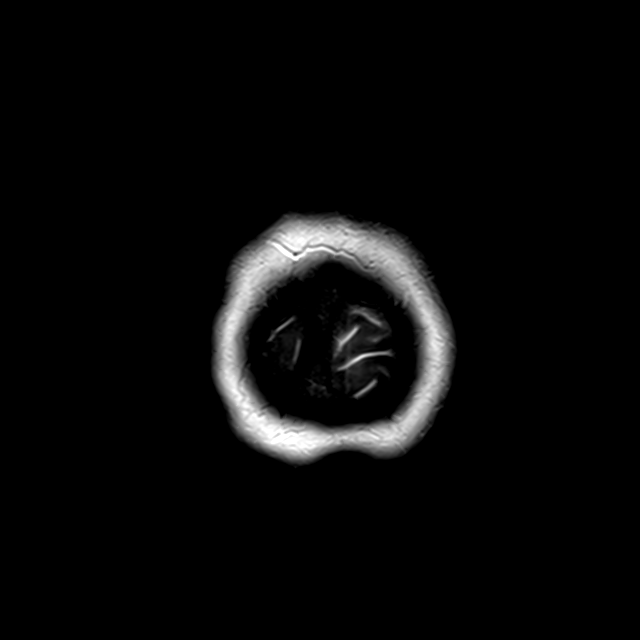

[44 of 48 positions shown; findings below may reference images not displayed]

FINDINGS: MRI HEAD WITHOUT CONTRAST

Brain: No acute infarction, hemorrhage, hydrocephalus, extra-axial
collection or mass lesion. No brain edema. No abnormal enhancement.

Vascular: Major arterial flow voids are maintained at the skull
base. Dural venous sinuses are assess below.

Skull and upper cervical spine: Normal marrow signal.

Sinuses/Orbits: Clear paranasal sinuses.  Unremarkable orbits.

Other: Small right mastoid effusion.

MR VENOGRAM WITHOUT CONTRAST

Positive for occlusive thrombus within the right transverse and
sigmoid sinuses. Thrombus extends into the right jugular bulb and
upper internal jugular vein. Some thrombus extends into adjacent
draining cortical veins. The superior sagittal sinus, left
transverse sinus, left sigmoid sinus and left jugular bulb are
patent. The straight sinus and visualized deep cerebral veins are
patent. Symmetric opacification of the cavernous sinuses.
IMPRESSION: 1. Positive for occlusive thrombus within the right transverse and
sigmoid sinuses. Thrombus extends into the right jugular bulb and
upper internal jugular vein. No evidence of brain edema or venous
infarct.
2. Small right mastoid effusion.

ADDENDUM:
Correction to the comparison: The prior CTV was on [DATE], not
the same day. While comparison across modalities is difficult, the
extent of thrombus may be slightly progressed in comparison to that
study.

Findings in the report and this addendum discussed with Dr. SAN NICOLAS via
telephone at [DATE] p.m.

*** End of Addendum ***
FINDINGS: MRI HEAD WITHOUT CONTRAST

Brain: No acute infarction, hemorrhage, hydrocephalus, extra-axial
collection or mass lesion. No brain edema. No abnormal enhancement.

Vascular: Major arterial flow voids are maintained at the skull
base. Dural venous sinuses are assess below.

Skull and upper cervical spine: Normal marrow signal.

Sinuses/Orbits: Clear paranasal sinuses.  Unremarkable orbits.

Other: Small right mastoid effusion.

MR VENOGRAM WITHOUT CONTRAST

Positive for occlusive thrombus within the right transverse and
sigmoid sinuses. Thrombus extends into the right jugular bulb and
upper internal jugular vein. Some thrombus extends into adjacent
draining cortical veins. The superior sagittal sinus, left
transverse sinus, left sigmoid sinus and left jugular bulb are
patent. The straight sinus and visualized deep cerebral veins are
patent. Symmetric opacification of the cavernous sinuses.
IMPRESSION: 1. Positive for occlusive thrombus within the right transverse and
sigmoid sinuses. Thrombus extends into the right jugular bulb and
upper internal jugular vein. No evidence of brain edema or venous
infarct.
2. Small right mastoid effusion.

## 2021-03-29 MED ORDER — SODIUM CHLORIDE 0.9 % IV BOLUS
500.0000 mL | Freq: Once | INTRAVENOUS | Status: AC
Start: 2021-03-29 — End: 2021-03-30
  Administered 2021-03-29: 500 mL via INTRAVENOUS

## 2021-03-29 MED ORDER — GADOBUTROL 1 MMOL/ML IV SOLN
7.5000 mL | Freq: Once | INTRAVENOUS | Status: AC | PRN
  Administered 2021-03-29: 7.5 mL via INTRAVENOUS

## 2021-03-29 MED ORDER — SODIUM CHLORIDE 0.9 % IV SOLN: INTRAVENOUS | Status: AC

## 2021-03-29 MED ORDER — ACETAMINOPHEN 650 MG RE SUPP: 650.0000 mg | Freq: Four times a day (QID) | RECTAL | Status: DC | PRN

## 2021-03-29 MED ORDER — METOCLOPRAMIDE HCL 5 MG/ML IJ SOLN
10.0000 mg | Freq: Once | INTRAMUSCULAR | Status: AC
  Administered 2021-03-29: 10 mg via INTRAVENOUS
  Filled 2021-03-29: qty 2

## 2021-03-29 MED ORDER — ACETAMINOPHEN 325 MG PO TABS
650.0000 mg | ORAL_TABLET | Freq: Four times a day (QID) | ORAL | Status: DC | PRN
  Administered 2021-03-30 – 2021-03-31 (×3): 650 mg via ORAL
  Filled 2021-03-29 (×3): qty 2

## 2021-03-29 NOTE — ED Notes (Signed)
Patient currently in MRI.

## 2021-03-29 NOTE — H&P (Signed)
History and Physical    Tammy Weber ZOX:[id number] DOB: 2000-09-01 DOA: 03/29/2021  PCP: Patient, No Pcp Per (Inactive)  Patient coming from: Home.  Chief Complaint: Worsening headache and muffled sounds.  HPI: Tammy Weber is a 20 y.o. female with recent diagnosis of cerebral venous thrombosis 2 days ago was started on low molecular weight heparin and plan to transition to Pradaxa after 5 days presents to the ER with worsening headache and muffled hearing.  Patient had come to the ER 2 days ago with headache mostly on the right temporal area which was lasting for last 3 weeks.  Did not have any focal deficits.  CT venogram showed right sigmoid and transverse venous sinus thrombosis.  Patient has been taking her low molecular weight heparin despite which her headache got worse along with the worsening hearing.  Patient was on contraceptive pills containing estrogen which patient has discontinued since 27 March 2021 when she was initially diagnosed with the thrombosis.  ED Course: In the ER patient had MRI of the brain and MRV which at this time shows features concerning for possible progression with thrombus becoming more occlusive.  Patient's low molecular weight heparin levels were therapeutic at 1.07.  Neurologist on-call was consulted.  Patient is being admitted for further management.  On exam patient appears nonfocal.  COVID test is pending.  Blood work shows microcytic hypochromic anemia.  Review of Systems: As per HPI, rest all negative.   Past Medical History:  Diagnosis Date   Vaccine for human papilloma virus (HPV) types 6, 11, 16, and 18 administered     Past Surgical History:  Procedure Laterality Date   NO PAST SURGERIES       reports that she has never smoked. She has never used smokeless tobacco. She reports current alcohol use. She reports that she does not use drugs.  Allergies  Allergen Reactions   Latex Itching    Family History  Problem Relation  Age of Onset   Colon cancer Paternal Aunt    Ovarian cancer Other     Prior to Admission medications   Medication Sig Start Date End Date Taking? Authorizing Provider  acetaminophen (TYLENOL) 500 MG tablet Take 500 mg by mouth every 6 (six) hours as needed for moderate pain or headache.   Yes [provider]  Cholecalciferol (VITAMIN D3) 1.25 MG (50000 UT) TABS Take 10,000 Units by mouth daily.   Yes [provider]  enoxaparin (LOVENOX) 100 MG/ML injection Inject 1 mL (100 mg total) into the skin daily for 4 doses. 03/28/21 04/01/21 Yes Pollyann Savoy, MD  ibuprofen (ADVIL) 200 MG tablet Take 200-400 mg by mouth every 6 (six) hours as needed for headache or moderate pain.   Yes [provider]  Magnesium 100 MG CAPS Take 100 mg by mouth daily.   Yes [provider]  Menaquinone-7 (VITAMIN K2 PO) Take 100 mcg by mouth daily.   Yes [provider]  dabigatran (PRADAXA) 150 MG CAPS capsule Take 1 capsule (150 mg total) by mouth 2 (two) times daily. 04/01/21 05/01/21  Pollyann Savoy, MD  Norethindrone Acetate-Ethinyl Estrad-FE (JUNEL FE 24) 1-20 MG-MCG(24) tablet Take 1 tablet by mouth daily. Patient not taking: No sig reported 06/15/20   Copland, Ilona Sorrel, PA-C    Physical Exam: Constitutional: Moderately built and nourished. Vitals:   03/29/21 1654 03/29/21 1832 03/29/21 2112  BP: (!) 147/100 129/74 139/89  Pulse: 99 86 80  Resp: 16 15 16  Temp: 99.3 F (37.4 C)  97.8 F (36.6 C)  TempSrc: Oral  Oral  SpO2: 100% 100% 99%   Eyes: Anicteric no pallor. ENMT: No discharge from the ears eyes nose and mouth. Neck: No mass felt.  No neck rigidity. Respiratory: No rhonchi or crepitations. Cardiovascular: S1-S2 heard. Abdomen: Soft nontender bowel sound present. Musculoskeletal: No edema. Skin: No rash. Neurologic: Alert awake oriented to time place and person.  Moves all extremities. Psychiatric: Appears normal.  Normal affect.   Labs  on Admission: I have personally reviewed following labs and imaging studies  CBC: Recent Labs  Lab 03/27/21 1305 03/29/21 1724  WBC 6.6 7.2  NEUTROABS 4.5 4.2  HGB 9.3* 9.8*  HCT 32.9* 35.3*  MCV 68.0* 69.2*  PLT 242 298   Basic Metabolic Panel: Recent Labs  Lab 03/27/21 1305 03/29/21 1724  NA 134* 138  K 3.9 4.1  CL 103 104  CO2 22 24  GLUCOSE 95 99  BUN 6 7  CREATININE 0.82 0.91  CALCIUM 9.1 9.5   GFR: Estimated Creatinine Clearance: 100.3 mL/min (by C-G formula based on SCr of 0.91 mg/dL). Liver Function Tests: Recent Labs  Lab 03/27/21 1305 03/29/21 1724  AST 15 16  ALT 11 12  ALKPHOS 63 58  BILITOT 0.8 0.2*  PROT 6.8 7.5  ALBUMIN 3.7 3.9   No results for input(s): LIPASE, AMYLASE in the last 168 hours. No results for input(s): AMMONIA in the last 168 hours. Coagulation Profile: No results for input(s): INR, PROTIME in the last 168 hours. Cardiac Enzymes: No results for input(s): CKTOTAL, CKMB, CKMBINDEX, TROPONINI in the last 168 hours. BNP (last 3 results) No results for input(s): PROBNP in the last 8760 hours. HbA1C: No results for input(s): HGBA1C in the last 72 hours. CBG: No results for input(s): GLUCAP in the last 168 hours. Lipid Profile: No results for input(s): CHOL, HDL, LDLCALC, TRIG, CHOLHDL, LDLDIRECT in the last 72 hours. Thyroid Function Tests: No results for input(s): TSH, T4TOTAL, FREET4, T3FREE, THYROIDAB in the last 72 hours. Anemia Panel: No results for input(s): VITAMINB12, FOLATE, FERRITIN, TIBC, IRON, RETICCTPCT in the last 72 hours. Urine analysis:    Component Value Date/Time   BILIRUBINUR neg 04/06/2019 1453   PROTEINUR Negative 04/06/2019 1453   NITRITE neg 04/06/2019 1453   LEUKOCYTESUR Moderate (2+) (A) 04/06/2019 1453   Sepsis Labs: @LABRCNTIP (procalcitonin:4,lacticidven:4) )No results found for this or any previous visit (from the past 240 hour(s)).   Radiological Exams on Admission: MR BRAIN WO  CONTRAST  Addendum Date: 03/29/2021   ADDENDUM REPORT: 03/29/2021 21:08 ADDENDUM: Correction to the comparison: The prior CTV was on 03/27/2021, not the same day. While comparison across modalities is difficult, the extent of thrombus may be slightly progressed in comparison to that study. Findings in the report and this addendum discussed with Dr. 03/29/2021 via telephone at 9:01 p.m. Electronically Signed   By: Ezzie Dural M.D.   On: 03/29/2021 21:08   Result Date: 03/29/2021 CLINICAL DATA:  Headache, new or worsening, neuro deficit (Age 27-49y) EXAM: MRI HEAD WITHOUT AND WITH CONTRAST MRV HEAD WITHOUT AND WITH CONTRAST TECHNIQUE: Multiplanar, multi-echo pulse sequences of the brain and surrounding structures were acquired without and with intravenous contrast. Angiographic images of the intracranial venous structures were acquired using MRV technique without and with intravenous contrast. CONTRAST:  CONTRAST 7.5 ML OF GADAVIST IV. COMPARISON:  Same day CTV FINDINGS: MRI HEAD WITHOUT CONTRAST Brain: No acute infarction, hemorrhage, hydrocephalus, extra-axial collection or mass lesion. No brain  edema. No abnormal enhancement. Vascular: Major arterial flow voids are maintained at the skull base. Dural venous sinuses are assess below. Skull and upper cervical spine: Normal marrow signal. Sinuses/Orbits: Clear paranasal sinuses.  Unremarkable orbits. Other: Small right mastoid effusion. MR VENOGRAM WITHOUT CONTRAST Positive for occlusive thrombus within the right transverse and sigmoid sinuses. Thrombus extends into the right jugular bulb and upper internal jugular vein. Some thrombus extends into adjacent draining cortical veins. The superior sagittal sinus, left transverse sinus, left sigmoid sinus and left jugular bulb are patent. The straight sinus and visualized deep cerebral veins are patent. Symmetric opacification of the cavernous sinuses. IMPRESSION: 1. Positive for occlusive thrombus within the right  transverse and sigmoid sinuses. Thrombus extends into the right jugular bulb and upper internal jugular vein. No evidence of brain edema or venous infarct. 2. Small right mastoid effusion. Electronically Signed: By: Feliberto Harts M.D. On: 03/29/2021 20:51   MR MRV HEAD W WO CONTRAST  Addendum Date: 03/29/2021   ADDENDUM REPORT: 03/29/2021 21:08 ADDENDUM: Correction to the comparison: The prior CTV was on 03/27/2021, not the same day. While comparison across modalities is difficult, the extent of thrombus may be slightly progressed in comparison to that study. Findings in the report and this addendum discussed with Dr. Ezzie Dural via telephone at 9:01 p.m. Electronically Signed   By: Feliberto Harts M.D.   On: 03/29/2021 21:08   Result Date: 03/29/2021 CLINICAL DATA:  Headache, new or worsening, neuro deficit (Age 11-49y) EXAM: MRI HEAD WITHOUT AND WITH CONTRAST MRV HEAD WITHOUT AND WITH CONTRAST TECHNIQUE: Multiplanar, multi-echo pulse sequences of the brain and surrounding structures were acquired without and with intravenous contrast. Angiographic images of the intracranial venous structures were acquired using MRV technique without and with intravenous contrast. CONTRAST:  CONTRAST 7.5 ML OF GADAVIST IV. COMPARISON:  Same day CTV FINDINGS: MRI HEAD WITHOUT CONTRAST Brain: No acute infarction, hemorrhage, hydrocephalus, extra-axial collection or mass lesion. No brain edema. No abnormal enhancement. Vascular: Major arterial flow voids are maintained at the skull base. Dural venous sinuses are assess below. Skull and upper cervical spine: Normal marrow signal. Sinuses/Orbits: Clear paranasal sinuses.  Unremarkable orbits. Other: Small right mastoid effusion. MR VENOGRAM WITHOUT CONTRAST Positive for occlusive thrombus within the right transverse and sigmoid sinuses. Thrombus extends into the right jugular bulb and upper internal jugular vein. Some thrombus extends into adjacent draining cortical veins. The  superior sagittal sinus, left transverse sinus, left sigmoid sinus and left jugular bulb are patent. The straight sinus and visualized deep cerebral veins are patent. Symmetric opacification of the cavernous sinuses. IMPRESSION: 1. Positive for occlusive thrombus within the right transverse and sigmoid sinuses. Thrombus extends into the right jugular bulb and upper internal jugular vein. No evidence of brain edema or venous infarct. 2. Small right mastoid effusion. Electronically Signed: By: Feliberto Harts M.D. On: 03/29/2021 20:51      Assessment/Plan Principal Problem:   Acute cerebral venous sinus thrombosis Active Problems:   Anemia    Acute cerebral venous sinus thrombosis with MRV done tonight showing features concerning for possible progression of her thrombus with possible occlusion.  Discussed with neurologist.  At this time patient's low molecular weight heparin is therapeutic at 1.07.  Neurologist is planning further transitioning to other anticoagulants.  We will follow-up neurology recommendations. Microcytic hypochromic anemia check iron levels with next blood draw.   DVT prophylaxis: Patient is on full dose anticoagulation.  Presently therapeutic at low molecular heparin.  Further doses and anticoagulant will  be decided by neurologist.  Discussed with pharmacy. Code Status: Full code. Family Communication: We will need to discuss with family. Disposition Plan: Home. Consults called: Neurology. Admission status: Observation.   Eduard Clos MD [facility] Pager [phone].  If 7PM-7AM, please contact night-coverage [email or web address] Password [facility]  03/29/2021, 11:56 PM

## 2021-03-29 NOTE — ED Provider Notes (Addendum)
[facility] EMERGENCY DEPARTMENT Provider Note   CSN: [id number] Arrival date & time: 03/29/21  1600     History Chief Complaint  Patient presents with   Hearing Problem    Tammy Weber is a 20 y.o. female.  HPI Patient presents with headache, vision changes, difficulty hearing. Has had headaches for around the last 3 weeks.  No real history of headaches.  Patient states she tested negative for COVID but the rest of her family had had COVID around a month ago.  It is around the time that symptoms started.  Seen in the ER yesterday.  Head CT done along with venogram and showed venous sinus thrombosis.  Started on Lovenox with the Pradaxa bridge.  Has had 3 Lovenox injections.  States that she started getting blurred vision and some difficulty hearing.  States that the hearing will be in both ears.  States feels if there is water in her ears.  No sore throat.  Still having some of the headaches 2.  A day ago her vision was blurred but that is improved.    Past Medical History:  Diagnosis Date   Vaccine for human papilloma virus (HPV) types 6, 11, 16, and 18 administered     There are no problems to display for this patient.   Past Surgical History:  Procedure Laterality Date   NO PAST SURGERIES       OB History     Gravida  0   Para  0   Term  0   Preterm  0   AB  0   Living  0      SAB  0   IAB  0   Ectopic  0   Multiple  0   Live Births  0           Family History  Problem Relation Age of Onset   Colon cancer Paternal Aunt    Ovarian cancer Other     Social History   Tobacco Use   Smoking status: Never   Smokeless tobacco: Never  Vaping Use   Vaping Use: Never used  Substance Use Topics   Alcohol use: Yes   Drug use: No    Home Medications Prior to Admission medications   Medication Sig Start Date End Date Taking? Authorizing Provider  dabigatran (PRADAXA) 150 MG CAPS capsule Take 1 capsule (150 mg total) by  mouth 2 (two) times daily. 04/01/21 05/01/21  Pollyann Savoy, MD  enoxaparin (LOVENOX) 100 MG/ML injection Inject 1 mL (100 mg total) into the skin daily for 4 doses. 03/28/21 04/01/21  Pollyann Savoy, MD  Norethindrone Acetate-Ethinyl Estrad-FE (JUNEL FE 24) 1-20 MG-MCG(24) tablet Take 1 tablet by mouth daily. 06/15/20   Copland, Ilona Sorrel, PA-C    Allergies    Latex  Review of Systems   Review of Systems  Constitutional:  Negative for appetite change.  HENT:  Negative for voice change.   Respiratory:  Negative for shortness of breath.   Gastrointestinal:  Negative for abdominal distention and abdominal pain.  Genitourinary:  Negative for flank pain.  Skin:  Negative for rash.  Neurological:  Positive for headaches.  Psychiatric/Behavioral:  Negative for confusion.    Physical Exam Updated Vital Signs BP 139/89 (BP Location: Right Arm)   Pulse 80   Temp 97.8 F (36.6 C) (Oral)   Resp 16   SpO2 99%   Physical Exam Vitals and nursing note reviewed.  Constitutional:  Appearance: Normal appearance.  HENT:     Head: Atraumatic.  Eyes:     Pupils: Pupils are equal, round, and reactive to light.  Cardiovascular:     Rate and Rhythm: Regular rhythm.  Pulmonary:     Breath sounds: No wheezing or rhonchi.  Musculoskeletal:        General: No tenderness.  Skin:    General: Skin is warm.  Neurological:     Mental Status: She is alert and oriented to person, place, and time.     Cranial Nerves: No cranial nerve deficit.    ED Results / Procedures / Treatments   Labs (all labs ordered are listed, but only abnormal results are displayed) Labs Reviewed  CBC WITH DIFFERENTIAL/PLATELET - Abnormal; Notable for the following components:      Result Value   Hemoglobin 9.8 (*)    HCT 35.3 (*)    MCV 69.2 (*)    MCH 19.2 (*)    MCHC 27.8 (*)    RDW 18.3 (*)    All other components within normal limits  COMPREHENSIVE METABOLIC PANEL - Abnormal; Notable for the following  components:   Total Bilirubin 0.2 (*)    All other components within normal limits  RESP PANEL BY RT-PCR (FLU A&B, COVID) [id number]    EKG None  Radiology MR BRAIN WO CONTRAST  Addendum Date: 03/29/2021   ADDENDUM REPORT: 03/29/2021 21:08 ADDENDUM: Correction to the comparison: The prior CTV was on 03/27/2021, not the same day. While comparison across modalities is difficult, the extent of thrombus may be slightly progressed in comparison to that study. Findings in the report and this addendum discussed with Dr. Ezzie Dural via telephone at 9:01 p.m. Electronically Signed   By: Feliberto Harts M.D.   On: 03/29/2021 21:08   Result Date: 03/29/2021 CLINICAL DATA:  Headache, new or worsening, neuro deficit (Age 106-49y) EXAM: MRI HEAD WITHOUT AND WITH CONTRAST MRV HEAD WITHOUT AND WITH CONTRAST TECHNIQUE: Multiplanar, multi-echo pulse sequences of the brain and surrounding structures were acquired without and with intravenous contrast. Angiographic images of the intracranial venous structures were acquired using MRV technique without and with intravenous contrast. CONTRAST:  CONTRAST 7.5 ML OF GADAVIST IV. COMPARISON:  Same day CTV FINDINGS: MRI HEAD WITHOUT CONTRAST Brain: No acute infarction, hemorrhage, hydrocephalus, extra-axial collection or mass lesion. No brain edema. No abnormal enhancement. Vascular: Major arterial flow voids are maintained at the skull base. Dural venous sinuses are assess below. Skull and upper cervical spine: Normal marrow signal. Sinuses/Orbits: Clear paranasal sinuses.  Unremarkable orbits. Other: Small right mastoid effusion. MR VENOGRAM WITHOUT CONTRAST Positive for occlusive thrombus within the right transverse and sigmoid sinuses. Thrombus extends into the right jugular bulb and upper internal jugular vein. Some thrombus extends into adjacent draining cortical veins. The superior sagittal sinus, left transverse sinus, left sigmoid sinus and left jugular bulb are patent. The  straight sinus and visualized deep cerebral veins are patent. Symmetric opacification of the cavernous sinuses. IMPRESSION: 1. Positive for occlusive thrombus within the right transverse and sigmoid sinuses. Thrombus extends into the right jugular bulb and upper internal jugular vein. No evidence of brain edema or venous infarct. 2. Small right mastoid effusion. Electronically Signed: By: Feliberto Harts M.D. On: 03/29/2021 20:51   MR MRV HEAD W WO CONTRAST  Addendum Date: 03/29/2021   ADDENDUM REPORT: 03/29/2021 21:08 ADDENDUM: Correction to the comparison: The prior CTV was on 03/27/2021, not the same day. While comparison across modalities is difficult, the extent of  thrombus may be slightly progressed in comparison to that study. Findings in the report and this addendum discussed with Dr. Ezzie Dural via telephone at 9:01 p.m. Electronically Signed   By: Feliberto Harts M.D.   On: 03/29/2021 21:08   Result Date: 03/29/2021 CLINICAL DATA:  Headache, new or worsening, neuro deficit (Age 23-49y) EXAM: MRI HEAD WITHOUT AND WITH CONTRAST MRV HEAD WITHOUT AND WITH CONTRAST TECHNIQUE: Multiplanar, multi-echo pulse sequences of the brain and surrounding structures were acquired without and with intravenous contrast. Angiographic images of the intracranial venous structures were acquired using MRV technique without and with intravenous contrast. CONTRAST:  CONTRAST 7.5 ML OF GADAVIST IV. COMPARISON:  Same day CTV FINDINGS: MRI HEAD WITHOUT CONTRAST Brain: No acute infarction, hemorrhage, hydrocephalus, extra-axial collection or mass lesion. No brain edema. No abnormal enhancement. Vascular: Major arterial flow voids are maintained at the skull base. Dural venous sinuses are assess below. Skull and upper cervical spine: Normal marrow signal. Sinuses/Orbits: Clear paranasal sinuses.  Unremarkable orbits. Other: Small right mastoid effusion. MR VENOGRAM WITHOUT CONTRAST Positive for occlusive thrombus within the right  transverse and sigmoid sinuses. Thrombus extends into the right jugular bulb and upper internal jugular vein. Some thrombus extends into adjacent draining cortical veins. The superior sagittal sinus, left transverse sinus, left sigmoid sinus and left jugular bulb are patent. The straight sinus and visualized deep cerebral veins are patent. Symmetric opacification of the cavernous sinuses. IMPRESSION: 1. Positive for occlusive thrombus within the right transverse and sigmoid sinuses. Thrombus extends into the right jugular bulb and upper internal jugular vein. No evidence of brain edema or venous infarct. 2. Small right mastoid effusion. Electronically Signed: By: Feliberto Harts M.D. On: 03/29/2021 20:51    Procedures Procedures   Medications Ordered in ED Medications  sodium chloride 0.9 % bolus 500 mL (has no administration in time range)  metoCLOPramide (REGLAN) injection 10 mg (has no administration in time range)  gadobutrol (GADAVIST) 1 MMOL/ML injection 7.5 mL (7.5 mLs Intravenous Contrast Given 03/29/21 2027)    ED Course  I have reviewed the triage vital signs and the nursing notes.  Pertinent labs & imaging results that were available during my care of the patient were reviewed by me and considered in my medical decision making (see chart for details).    MDM Rules/Calculators/A&P                           Patient with continued headache.  Has known central venous sinus thrombosis.  Has been on Lovenox shots.  Pain has changed a little bit and now ears feel like there is fluid in them.  TMs normal.  After discussion with neurology MRV and MRI done.  Tissue of the brain looks good but possible small amount of enlargement of the clot, but different modalities between CT and MRI.  Seen by neurology.  Recommends admission to the hospital for IV heparin.  Seen by stroke team tomorrow.  Will admit Final Clinical Impression(s) / ED Diagnoses Final diagnoses:  Acute cerebral venous sinus  thrombosis    Rx / DC Orders ED Discharge Orders     None        Benjiman Core, MD 03/29/21 2212    Benjiman Core, MD 04/02/21 [phone]

## 2021-03-29 NOTE — ED Triage Notes (Signed)
Patient discharged two days ago with lovenox injections for blood clot, has had two doses, state she woke this morning and her hearing had changed, reports "it sounds like I'm under water". LKW 2300 yesterday. No other neuro deficits.

## 2021-03-29 NOTE — ED Provider Notes (Signed)
Emergency Medicine Provider Triage Evaluation Note  Tammy Weber , a 20 y.o. female  was evaluated in triage.  Pt complains of headache, blurred vision, and trouble hearing that has been intermittent since last night.  She was recently seen and evaluated in the emergency department on Tuesday and was diagnosed with a central venous thrombosis.  She has been anticoagulated with enoxaparin and being treated in the outpatient setting.  This morning she woke up and states that she feels like she is underwater and having muffled hearing.  Also been complaining of intermittent blurred vision.  She denies any focal weakness/numbness in the upper or lower extremities, loss of consciousness, nausea, vomiting, fever, or chills.  Review of Systems  Positive:  Negative: See above.  Physical Exam  BP (!) 147/100 (BP Location: Left Arm)   Pulse 99   Temp 99.3 F (37.4 C) (Oral)   Resp 16   SpO2 100%  Gen:   Awake, no distress   Resp:  Normal effort  MSK:   Moves extremities without difficulty  Other:  Cranial nerves II through XII are intact.  5/5 strength in the upper and lower extremities.  Normal sensation in the upper lower extremities.  Noted statical kinesia on rapid alternate movements.  Normal heel-to-shin.  Normal point-to-point localization.  Medical Decision Making  Medically screening exam initiated at 5:22 PM.  Appropriate orders placed.  [name] was informed that the remainder of the evaluation will be completed by another provider, this initial triage assessment does not replace that evaluation, and the importance of remaining in the ED until their evaluation is complete.     Teressa Lower, PA-C 03/29/21 1725    Benjiman Core, MD 03/29/21 2224

## 2021-03-29 NOTE — Consult Note (Addendum)
NEUROLOGY CONSULTATION NOTE   Date of service: March 29, 2021 Patient Name: Tammy Weber MRN:  [id number] DOB:  February 14, 2001 Reason for consult: "CVST" Requesting Provider: Eduard Clos, MD _ _ _   _ __   _ __ _ _  __ __   _ __   __ _  History of Present Illness  Tammy Weber is a 20 y.o. female with no significant PMH who presents with worsening headache and muffled hearing. She has had Right sided throbbing headache going mostly in the temple and back for the last 3 weeks with associated phonophobia for 3 weeks now. She was seen in the ED 2 days ago and found to have R sigmoid and transverse sinus thrombosis. She was started on SQ LWMH 100mg  Q24 hours x 5 days for the acute period followed by Pradaxa 150mg  BID long term and discharged home.  She presents today with worsening headache and worsening pressure in her head with muffled hearing almost like she is underwater.  Workup with MRI and MR Venogram w + w/o C with occlusive thrombus in R transverse and sigmoid sinus extending into the R jugular bulb and upper internal jugular vein. Althou difficult to compare CT Venogram and MR Venogram due to differences in modalities but the thrombus appears to have progressed a bit more and is now occlusive where there was some flow in the Right sigmoid and transverse sinus on the CT Venogram. She however, has no stroke, no cerebral edema, no hemorrhage.   She denies any blurred vision, endorses muffled hearing. She denies any ear pain. Denies any fever, no tinnitus, no ear discharge. No history of misscarriages, no significant EtOH use, vapes but does not smoke tobacco, no recreational drugs.  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   ROS   Constitutional Denies weight loss, fever and chills.   HEENT Bluured vision earlier but resolved. No changes in hearing.   Respiratory Denies SOB and cough.   CV Denies palpitations and CP    GI Denies abdominal pain, nausea, vomiting and diarrhea.   GU Denies dysuria and urinary frequency.   MSK Denies myalgia and joint pain.  Skin Denies rash and pruritus.   Neurological Endorses headache but no syncope.   Psychiatric Denies recent changes in mood. Denies anxiety and depression.    Past History   Past Medical History:  Diagnosis Date  . Vaccine for human papilloma virus (HPV) types 6, 11, 16, and 18 administered    Past Surgical History:  Procedure Laterality Date  . NO PAST SURGERIES     Family History  Problem Relation Age of Onset  . Colon cancer Paternal Aunt   . Ovarian cancer Other    Social History   Socioeconomic History  . Marital status: Single    Spouse name: Not on file  . Number of children: Not on file  . Years of education: Not on file  . Highest education level: Not on file  Occupational History  . Not on file  Tobacco Use  . Smoking status: Never  . Smokeless tobacco: Never  Vaping Use  . Vaping Use: Never used  Substance and Sexual Activity  . Alcohol use: Yes  . Drug use: No  . Sexual activity: Yes    Birth control/protection: Pill  Other Topics Concern  . Not on file  Social History Narrative  . Not on file   Social Determinants of  Health   Financial Resource Strain: Not on file  Food Insecurity: Not on file  Transportation Needs: Not on file  Physical Activity: Not on file  Stress: Not on file  Social Connections: Not on file   Allergies  Allergen Reactions  . Latex Itching    Medications  (Not in a hospital admission)    Vitals   Vitals:   03/29/21 1654 03/29/21 1832 03/29/21 2112  BP: (!) 147/100 129/74 139/89  Pulse: 99 86 80  Resp: 16 15 16   Temp: 99.3 F (37.4 C)  97.8 F (36.6 C)  TempSrc: Oral  Oral  SpO2: 100% 100% 99%     There is no height or weight on file to calculate BMI.  Physical Exam   General: Laying comfortably in bed; in no acute distress.  HENT: Normal oropharynx and mucosa.  Normal external appearance of ears and nose.  Neck: Supple, no pain or tenderness  CV: No JVD. No peripheral edema.  Pulmonary: Symmetric Chest rise. Normal respiratory effort.  Abdomen: Soft to touch, non-tender.  Ext: No cyanosis, edema, or deformity  Skin: No rash. Normal palpation of skin.   Musculoskeletal: Normal digits and nails by inspection. No clubbing.   Neurologic Examination  Mental status/Cognition: Alert, oriented to self, place, month and year, good attention.  Speech/language: Fluent, comprehension intact, object naming intact, repetition intact.  Cranial nerves:   CN II Pupils equal and reactive to light, no VF deficits    CN III,IV,VI EOM intact, no gaze preference or deviation, no nystagmus    CN V normal sensation in V1, V2, and V3 segments bilaterally    CN VII no asymmetry, no nasolabial fold flattening    CN VIII normal hearing to speech    CN IX & X normal palatal elevation, no uvular deviation    CN XI 5/5 head turn and 5/5 shoulder shrug bilaterally    CN XII midline tongue protrusion    Motor:  Muscle bulk: normal, tone normal, pronator drift none tremor none Mvmt Root Nerve  Muscle Right Left Comments  SA C5/6 Ax Deltoid 5 5   EF C5/6 Mc Biceps 5 5   EE C6/7/8 Rad Triceps 5 5   WF C6/7 Med FCR     WE C7/8 PIN ECU     F Ab C8/T1 U ADM/FDI 5 5   HF L1/2/3 Fem Illopsoas 5 5   KE L2/3/4 Fem Quad 5 5   DF L4/5 D Peron Tib Ant 5 5   PF S1/2 Tibial Grc/Sol 5 5    Reflexes:  Right Left Comments  Pectoralis      Biceps (C5/6) 2 2   Brachioradialis (C5/6) 2 2    Triceps (C6/7) 2 2    Patellar (L3/4) 2 2    Achilles (S1) 2 2    Hoffman      Plantar withdraws withdraws   Jaw jerk    Sensation:  Light touch intact   Pin prick    Temperature    Vibration   Proprioception    Coordination/Complex Motor:  - Finger to Nose intact BL - Heel to shin intact BL - Rapid alternating movement are normal - Gait: Stride length normal. Arm swing normal.  Base width narrow. Able to toe walk and heel walk.  Labs   CBC:  Recent Labs  Lab 03/27/21 1305 03/29/21 1724  WBC 6.6 7.2  NEUTROABS 4.5 4.2  HGB 9.3* 9.8*  HCT 32.9* 35.3*  MCV 68.0* 69.2*  PLT 242 298    Basic Metabolic Panel:  Lab Results  Component Value Date   NA 138 03/29/2021   K 4.1 03/29/2021   CO2 24 03/29/2021   GLUCOSE 99 03/29/2021   BUN 7 03/29/2021   CREATININE 0.91 03/29/2021   CALCIUM 9.5 03/29/2021   GFRNONAA >60 03/29/2021   Lipid Panel: No results found for: LDLCALC HgbA1c: No results found for: HGBA1C Urine Drug Screen: No results found for: LABOPIA, COCAINSCRNUR, LABBENZ, AMPHETMU, THCU, LABBARB  Alcohol Level No results found for: [facility]  MRI Brain: Personally reviewed and no acute stroke, no ICH, no cerebral edema.  MR Venogram head w + w/o C: Personally reviewed and to me the thrombus appears occlusive in the R sigmoid and R transverse sinus and maybe progressed compared to the CT Venogram. I discussed the images with radiology too and they too think that the thrombus may have progressed.  Impression   ALEJANDRIA WESSELLS is a 20 y.o. female with recent diagnosis of CVST who presents with worsening headache and pressure sensation in her head along with muffled hearing for the last 2 days. She is on SQ lovenox Q24 hours at home with plan to switch to pradaxa after day 5. Her images are somewhat concerning for potential progresion of the CVST and her clinical symptoms also appear to be suggestive of that too. Her neurologic examination is normal with no focal deficit. She does not have new stroke, no ICH, no cerebral edema.  There is concern for clinical and radiographic progression of the CVST. However, her LMWH levels are therapeutic at 1.07. I do not think that transitioning her to IV heparin at this point is the right step specially given therapeutic levels with LMWH. It does make me think about ?heparin failure. It will be helpful to get  strokes team and potentially hematology's input on this to see if we should consider alternatives like bivalirudin or argatroban or fondaparinux instead as a bridge to dabigatran. However, there is not much data on their use in CVST.  Primary Diagnosis:  Cerebral sinus venous thrombosis.  Recommendations  - Will have stroke team and perhaps seek hematology inpatient for ?heparin failure and alternative agents as discussed above. - Transition to Dabigatran 150mg  BID after a total of 5 days of Anticoagulation. - Stroke team to follow along. ______________________________________________________________________  Plan discussed with Dr. over secure chat and in person and also with pharmacy over phone. Patient and her mother were updated over the phone.  Thank you for the opportunity to take part in the care of this patient. If you have any further questions, please contact the neurology consultation attending.  Signed,  Toniann Fail [facility] Pager Number [phone] [phone] _ _ _   _ __   _ __ _ _  __ __   _ __   __ _

## 2021-03-30 ENCOUNTER — Encounter (HOSPITAL_COMMUNITY): Payer: Self-pay | Admitting: Family Medicine

## 2021-03-30 DIAGNOSIS — I676 Nonpyogenic thrombosis of intracranial venous system: Secondary | ICD-10-CM | POA: Diagnosis present

## 2021-03-30 DIAGNOSIS — H918X3 Other specified hearing loss, bilateral: Secondary | ICD-10-CM | POA: Diagnosis present

## 2021-03-30 DIAGNOSIS — D519 Vitamin B12 deficiency anemia, unspecified: Secondary | ICD-10-CM | POA: Diagnosis not present

## 2021-03-30 DIAGNOSIS — D649 Anemia, unspecified: Secondary | ICD-10-CM

## 2021-03-30 DIAGNOSIS — E538 Deficiency of other specified B group vitamins: Secondary | ICD-10-CM | POA: Diagnosis present

## 2021-03-30 DIAGNOSIS — D509 Iron deficiency anemia, unspecified: Secondary | ICD-10-CM | POA: Diagnosis present

## 2021-03-30 DIAGNOSIS — R519 Headache, unspecified: Secondary | ICD-10-CM | POA: Diagnosis present

## 2021-03-30 DIAGNOSIS — H538 Other visual disturbances: Secondary | ICD-10-CM | POA: Diagnosis present

## 2021-03-30 DIAGNOSIS — G08 Intracranial and intraspinal phlebitis and thrombophlebitis: Secondary | ICD-10-CM | POA: Diagnosis not present

## 2021-03-30 DIAGNOSIS — Z20822 Contact with and (suspected) exposure to covid-19: Secondary | ICD-10-CM | POA: Diagnosis present

## 2021-03-30 DIAGNOSIS — T384X5A Adverse effect of oral contraceptives, initial encounter: Secondary | ICD-10-CM | POA: Diagnosis present

## 2021-03-30 LAB — IRON AND TIBC
Iron: 15 ug/dL — ABNORMAL LOW (ref 28–170)
Saturation Ratios: 3 % — ABNORMAL LOW (ref 10.4–31.8)
TIBC: 539 ug/dL — ABNORMAL HIGH (ref 250–450)
UIBC: 524 ug/dL

## 2021-03-30 LAB — BASIC METABOLIC PANEL
Anion gap: 8 (ref 5–15)
BUN: 5 mg/dL — ABNORMAL LOW (ref 6–20)
CO2: 21 mmol/L — ABNORMAL LOW (ref 22–32)
Calcium: 8.9 mg/dL (ref 8.9–10.3)
Chloride: 108 mmol/L (ref 98–111)
Creatinine, Ser: 0.77 mg/dL (ref 0.44–1.00)
GFR, Estimated: >60 mL/min (ref >60)
Glucose, Bld: 94 mg/dL (ref 70–99)
Potassium: 4.1 mmol/L (ref 3.5–5.1)
Sodium: 137 mmol/L (ref 135–145)

## 2021-03-30 LAB — RESP PANEL BY RT-PCR (FLU A&B, COVID) ARPGX2
Influenza A by PCR: NEGATIVE
Influenza B by PCR: NEGATIVE
SARS Coronavirus 2 by RT PCR: NEGATIVE

## 2021-03-30 LAB — CBC
HCT: 30.7 % — ABNORMAL LOW (ref 36.0–46.0)
Hemoglobin: 8.5 g/dL — ABNORMAL LOW (ref 12.0–15.0)
MCH: 19.1 pg — ABNORMAL LOW (ref 26.0–34.0)
MCHC: 27.7 g/dL — ABNORMAL LOW (ref 30.0–36.0)
MCV: 68.8 fL — ABNORMAL LOW (ref 80.0–100.0)
Platelets: 224 10*3/uL (ref 150–400)
RBC: 4.46 MIL/uL (ref 3.87–5.11)
RDW: 17.8 % — ABNORMAL HIGH (ref 11.5–15.5)
WBC: 6.3 10*3/uL (ref 4.0–10.5)
nRBC: 0 % (ref 0.0–0.2)

## 2021-03-30 LAB — RETICULOCYTES
Immature Retic Fract: 14.5 % (ref 2.3–15.9)
RBC.: 4.74 MIL/uL (ref 3.87–5.11)
Retic Count, Absolute: 62.1 10*3/uL (ref 19.0–186.0)
Retic Ct Pct: 1.3 % (ref 0.4–3.1)

## 2021-03-30 LAB — FERRITIN: Ferritin: 3 ng/mL — ABNORMAL LOW (ref 11–307)

## 2021-03-30 LAB — FOLATE: Folate: 4 ng/mL — ABNORMAL LOW (ref >5.9)

## 2021-03-30 LAB — VITAMIN B12: Vitamin B-12: 177 pg/mL — ABNORMAL LOW (ref 180–914)

## 2021-03-30 LAB — PREGNANCY, URINE: Preg Test, Ur: NEGATIVE

## 2021-03-30 LAB — HIV ANTIBODY (ROUTINE TESTING W REFLEX): HIV Screen 4th Generation wRfx: NONREACTIVE

## 2021-03-30 MED ORDER — VITAMIN B-12 1000 MCG PO TABS
1000.0000 ug | ORAL_TABLET | Freq: Every day | ORAL | Status: DC
  Administered 2021-03-31: 1000 ug via ORAL
  Filled 2021-03-30: qty 1

## 2021-03-30 MED ORDER — SODIUM CHLORIDE 0.9 % IV SOLN
250.0000 mg | Freq: Every day | INTRAVENOUS | Status: AC
Start: 2021-03-30 — End: 2021-03-31
  Administered 2021-03-30 – 2021-03-31 (×2): 250 mg via INTRAVENOUS
  Filled 2021-03-30 (×2): qty 20

## 2021-03-30 MED ORDER — CYANOCOBALAMIN 1000 MCG/ML IJ SOLN
1000.0000 ug | Freq: Once | INTRAMUSCULAR | Status: AC
  Administered 2021-03-30: 1000 ug via SUBCUTANEOUS
  Filled 2021-03-30: qty 1

## 2021-03-30 MED ORDER — HEPARIN (PORCINE) 25000 UT/250ML-% IV SOLN
1150.0000 [IU]/h | INTRAVENOUS | Status: AC
  Administered 2021-03-30: 1000 [IU]/h via INTRAVENOUS
  Filled 2021-03-30: qty 250

## 2021-03-30 NOTE — Consult Note (Addendum)
[place]  [phone] [phone] [place]  Referring MD:  Dr. 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  At that time, her case was discussed with neurology who recommended Lovenox x5 days and then transition to oral Pradaxa twice a day thereafter.  She presented to the emergency department with worsening headache and muffled sounds.  In the emergency department, she had an MRI of the brain which was positive for occlusive thrombus within the right transverse and sigmoid sinuses, thrombus extends into the right jugular bulb and upper internal jugular vein, no evidence of brain edema or venous infarct, small right mastoid effusion.  On admission, the patient's low molecular weight heparin level was therapeutic at 1.07.  On-call neurology was consulted and the patient was admitted for further management.  There is concern for potential progression of her CVST and clinical symptoms were also suggestive of that as well.  They suggested having the stroke team evaluate the patient and also for hematology evaluation for possible heparin failure and for suggestions regarding alternative agents.  They also suggested transition to dabigatran 150 mg twice daily after a total of 5 days of anticoagulation.  Of note, the patient was taking contraceptive pills containing estrogen which the patient discontinued as of 03/27/2021 when she was initially diagnosed with the CVST.  I met with the patient and her mother in the emergency department.  Patient reports that she has had improvement in her headache at this time.  She also reports that the muffled hearing has improved but still has some muffled hearing in the right ear.  Her mother tells me that she has been having headaches since shortly after  Labor Day.  These headaches were worsening.  Her mother also notes that multiple family members had symptoms suggestive of COVID around that time and she is concerned that the patient may have had COVID earlier this month.  However, the patient did not test to confirm.  The patient confirmed that she was taking Lovenox daily without any missed doses.  She also confirmed that she stopped taking oral contraception (Junel FE 24) on 03/27/2021.  She is not having any chest pain, shortness of breath, abdominal pain, nausea, vomiting, lower extremity edema.  Denies bleeding.  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  She denies personal and family history of DVT and PE.  Denies history of alcohol tobacco use.  Hematology was asked see the patient to make recommendations regarding her CVST.  Past Medical History:  Diagnosis Date   Vaccine for human papilloma virus (HPV) types 6, 11, 16, and 18 administered   :  Past Surgical History:  Procedure Laterality Date   NO PAST SURGERIES    :  CURRENT MEDS: Current Facility-Administered Medications  Medication Dose Route Frequency Provider Last Rate Last Admin   0.9 %  sodium chloride infusion   Intravenous Continuous Rise Patience, MD 75 mL/hr at 03/30/21 0043 New Bag at 03/30/21 0043   acetaminophen (TYLENOL) tablet 650 mg  650 mg Oral Q6H PRN Rise Patience, MD       Or   acetaminophen (TYLENOL) suppository 650 mg  650 mg Rectal Q6H PRN Rise Patience, MD       heparin ADULT infusion 100 units/mL (25000 units/29mL)  1,000 Units/hr  Intravenous Continuous Rolla Flatten, [facility]       Current Outpatient Medications  Medication Sig Dispense Refill   acetaminophen (TYLENOL) 500 MG tablet Take 500 mg by mouth every 6 (six) hours as needed for moderate pain or headache.     Cholecalciferol (VITAMIN D3) 1.25 MG (50000 UT) TABS Take 10,000 Units by mouth daily.     enoxaparin (LOVENOX) 100 MG/ML injection  Inject 1 mL (100 mg total) into the skin daily for 4 doses. 4 mL 0   ibuprofen (ADVIL) 200 MG tablet Take 200-400 mg by mouth every 6 (six) hours as needed for headache or moderate pain.     Magnesium 100 MG CAPS Take 100 mg by mouth daily.     Menaquinone-7 (VITAMIN K2 PO) Take 100 mcg by mouth daily.     [START ON 04/01/2021] dabigatran (PRADAXA) 150 MG CAPS capsule Take 1 capsule (150 mg total) by mouth 2 (two) times daily. 60 capsule 0   Norethindrone Acetate-Ethinyl Estrad-FE (JUNEL FE 24) 1-20 MG-MCG(24) tablet Take 1 tablet by mouth daily. (Patient not taking: No sig reported) 84 tablet 3    Allergies  Allergen Reactions   Latex Itching  :  Family History  Problem Relation Age of Onset   Colon cancer Paternal Aunt    Ovarian cancer Other   : Social History   Socioeconomic History   Marital status: Single    Spouse name: Not on file   Number of children: Not on file   Years of education: Not on file   Highest education level: Not on file  Occupational History   Not on file  Tobacco Use   Smoking status: Never   Smokeless tobacco: Never  Vaping Use   Vaping Use: Never used  Substance and Sexual Activity   Alcohol use: Yes   Drug use: No   Sexual activity: Yes    Birth control/protection: Pill  Other Topics Concern   Not on file  Social History Narrative   Not on file   Social Determinants of Health   Financial Resource Strain: Not on file  Food Insecurity: Not on file  Transportation Needs: Not on file  Physical Activity: Not on file  Stress: Not on file  Social Connections: Not on file  Intimate Partner Violence: Not on file  :  REVIEW OF SYSTEMS:  A comprehensive 14 point review of systems was negative except as noted in the HPI.    Exam: Patient Vitals for the past 24 hrs:  BP Temp Temp src Pulse Resp SpO2 Weight  03/30/21 1000 (!) 148/87 -- -- 90 16 96 % --  03/30/21 0833 (!) 147/91 98 F (36.7 C) Oral 87 17 100 % 71.4 kg  03/30/21 0700 116/79 --  -- 68 16 100 % --  03/30/21 0600 140/79 -- -- (!) 56 14 100 % --  03/30/21 0500 133/90 -- -- 68 16 100 % --  03/30/21 0400 139/84 -- -- 60 16 96 % --  03/30/21 0300 123/76 -- -- 71 16 97 % --  03/30/21 0200 (!) 141/83 -- -- 72 16 100 % --  03/30/21 0100 138/82 98.4 F (36.9 C) Oral 80 16 100 % --  03/29/21 2215 (!) 142/77 -- -- 87 -- 96 % --  03/29/21 2112 139/89 97.8 F (36.6 C) Oral 80 16 99 % --  03/29/21 1832 129/74 -- -- 86 15 100 % --  03/29/21 1654 (!) 147/100 99.3 F (37.4 C) Oral 99 16 100 % --  General:  well-nourished in no acute distress.   Eyes:  no scleral icterus.   ENT:  There were no oropharyngeal lesions.   Lymphatics:  Negative cervical, supraclavicular or axillary adenopathy.   Respiratory: lungs were clear bilaterally without wheezing or crackles.   Cardiovascular:  Regular rate and rhythm, S1/S2, without murmur, rub or gallop.  There was no pedal edema.   GI:  abdomen was soft, flat, nontender, nondistended, without organomegaly.   Skin exam was without ecchymosis, petechiae.   Neuro exam was nonfocal.  Patient was alert and oriented.  Attention was good.   Language was appropriate.  Mood was normal without depression.  Speech was not pressured.  Thought content was not tangential.    LABS:  Lab Results  Component Value Date   WBC 6.3 03/30/2021   HGB 8.5 (L) 03/30/2021   HCT 30.7 (L) 03/30/2021   PLT 224 03/30/2021   GLUCOSE 94 03/30/2021   ALT 12 03/29/2021   AST 16 03/29/2021   NA 137 03/30/2021   K 4.1 03/30/2021   CL 108 03/30/2021   CREATININE 0.77 03/30/2021   BUN 5 (L) 03/30/2021   CO2 21 (L) 03/30/2021    MR BRAIN WO CONTRAST  Addendum Date: 03/29/2021   ADDENDUM REPORT: 03/29/2021 21:08 ADDENDUM: Correction to the comparison: The prior CTV was on 03/27/2021, not the same day. While comparison across modalities is difficult, the extent of thrombus may be slightly progressed in comparison to that study. Findings in the report and this  addendum discussed with Dr. Milas Gain via telephone at 9:01 p.m. Electronically Signed   By: Margaretha Sheffield M.D.   On: 03/29/2021 21:08   Result Date: 03/29/2021 CLINICAL DATA:  Headache, new or worsening, neuro deficit (Age 53-49y) EXAM: MRI HEAD WITHOUT AND WITH CONTRAST MRV HEAD WITHOUT AND WITH CONTRAST TECHNIQUE: Multiplanar, multi-echo pulse sequences of the brain and surrounding structures were acquired without and with intravenous contrast. Angiographic images of the intracranial venous structures were acquired using MRV technique without and with intravenous contrast. CONTRAST:  CONTRAST 7.5 ML OF GADAVIST IV. COMPARISON:  Same day CTV FINDINGS: MRI HEAD WITHOUT CONTRAST Brain: No acute infarction, hemorrhage, hydrocephalus, extra-axial collection or mass lesion. No brain edema. No abnormal enhancement. Vascular: Major arterial flow voids are maintained at the skull base. Dural venous sinuses are assess below. Skull and upper cervical spine: Normal marrow signal. Sinuses/Orbits: Clear paranasal sinuses.  Unremarkable orbits. Other: Small right mastoid effusion. MR VENOGRAM WITHOUT CONTRAST Positive for occlusive thrombus within the right transverse and sigmoid sinuses. Thrombus extends into the right jugular bulb and upper internal jugular vein. Some thrombus extends into adjacent draining cortical veins. The superior sagittal sinus, left transverse sinus, left sigmoid sinus and left jugular bulb are patent. The straight sinus and visualized deep cerebral veins are patent. Symmetric opacification of the cavernous sinuses. IMPRESSION: 1. Positive for occlusive thrombus within the right transverse and sigmoid sinuses. Thrombus extends into the right jugular bulb and upper internal jugular vein. No evidence of brain edema or venous infarct. 2. Small right mastoid effusion. Electronically Signed: By: Margaretha Sheffield M.D. On: 03/29/2021 20:51   MR MRV HEAD W WO CONTRAST  Addendum Date: 03/29/2021   ADDENDUM  REPORT: 03/29/2021 21:08 ADDENDUM: Correction to the comparison: The prior CTV was on 03/27/2021, not the same day. While comparison across modalities is difficult, the extent of thrombus may be slightly progressed in comparison to that study. Findings in the report and this addendum discussed with Dr. Milas Gain via  telephone at 9:01 p.m. Electronically Signed   By: Margaretha Sheffield M.D.   On: 03/29/2021 21:08   Result Date: 03/29/2021 CLINICAL DATA:  Headache, new or worsening, neuro deficit (Age 27-49y) EXAM: MRI HEAD WITHOUT AND WITH CONTRAST MRV HEAD WITHOUT AND WITH CONTRAST TECHNIQUE: Multiplanar, multi-echo pulse sequences of the brain and surrounding structures were acquired without and with intravenous contrast. Angiographic images of the intracranial venous structures were acquired using MRV technique without and with intravenous contrast. CONTRAST:  CONTRAST 7.5 ML OF GADAVIST IV. COMPARISON:  Same day CTV FINDINGS: MRI HEAD WITHOUT CONTRAST Brain: No acute infarction, hemorrhage, hydrocephalus, extra-axial collection or mass lesion. No brain edema. No abnormal enhancement. Vascular: Major arterial flow voids are maintained at the skull base. Dural venous sinuses are assess below. Skull and upper cervical spine: Normal marrow signal. Sinuses/Orbits: Clear paranasal sinuses.  Unremarkable orbits. Other: Small right mastoid effusion. MR VENOGRAM WITHOUT CONTRAST Positive for occlusive thrombus within the right transverse and sigmoid sinuses. Thrombus extends into the right jugular bulb and upper internal jugular vein. Some thrombus extends into adjacent draining cortical veins. The superior sagittal sinus, left transverse sinus, left sigmoid sinus and left jugular bulb are patent. The straight sinus and visualized deep cerebral veins are patent. Symmetric opacification of the cavernous sinuses. IMPRESSION: 1. Positive for occlusive thrombus within the right transverse and sigmoid sinuses. Thrombus extends into  the right jugular bulb and upper internal jugular vein. No evidence of brain edema or venous infarct. 2. Small right mastoid effusion. Electronically Signed: By: Margaretha Sheffield M.D. On: 03/29/2021 20:51   CT VENOGRAM HEAD  Result Date: 03/27/2021 CLINICAL DATA:  Dural venous sinus thrombosis suspected EXAM: CT VENOGRAM HEAD TECHNIQUE: Venographic phase images of the brain were obtained following the administration of intravenous contrast. Multiplanar reformats and maximum intensity projections were generated. CONTRAST:  39mL OMNIPAQUE IOHEXOL 350 MG/ML SOLN COMPARISON:  None. FINDINGS: Brain: There is no evidence of acute intracranial hemorrhage, extra-axial fluid collection, or acute infarct. The ventricles are normal in size. There is no mass lesion. There is no midline shift. Vascular: There is abnormal density in the right transverse sinus on the noncontrast images with occlusive filling defect in the transverse sinus extending to the sigmoid sinus and jugular bulb on the venous phase post-contrast images. The left transverse and sigmoid sinuses are patent. The superior and inferior sagittal sinuses are patent. The internal cerebral veins and basal veins of [place] are patent. The major intracranial arteries are patent. Skull: Normal. Negative for fracture or focal lesion. Sinuses/Orbits: The imaged paranasal sinuses are clear. The imaged globes and orbits are unremarkable. Other: None. 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 These results were called by telephone at the time of interpretation on 03/27/2021 at 2:49 pm to provider Sherwood Gambler , who verbally acknowledged these results. Electronically Signed   By: Valetta Mole M.D.   On: 03/27/2021 14:53     ASSESSMENT AND PLAN:  This is a 20 year old female diagnosed with CVST and started on subcutaneous Lovenox 100 mg daily x5 days for the acute.  Which was to be followed by Pradaxa 150 mg twice  daily.  Due to worsening symptoms, she returned to the emergency department and repeat imaging shows possible progression of the thrombus.   CVST  -Discussed treatment for CVST includes anticoagulation typically with heparin or low molecular weight heparin.  However, patient had worsening symptoms while on low molecular weight heparin and scans show possible slight progression  compared to prior study although difficult to compare across modalities. -Agree with starting the patient on a heparin drip.  Thereafter, the patient may be switched to warfarin or DOAC.  Reasonable to change to DOAC upon discharge due to ease of administration and no need for routine lab monitoring. -Current guidelines recommend duration of treatment for about 6 months.  However, patient may benefit from hypercoagulable work-up to determine if she needs longer treatment.  May consider hyper coag work-up here in the hospital versus the outpatient. -Recommend permanent discontinuation of oral contraception.  Thank you for this referral.  Mikey Bussing, DNP, AGPCNP-BC, AOCNP  Addendum  I have seen the patient, examined her. I agree with the assessment and and plan and have edited the notes.   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  MRI of head showed persistent occlusive thrombus within the right transverse and sigmoid sinuses, may be slightly worse compared to the CT head 2 days ago.  Patient has been compliant with Lovenox injection. Agree with heparin infusion for now and discharge on DOAC. I am not sure if this is true failure of lovenox, but it's reasonable to switch her anticoagulation. Her CVST is felt to be related to her birth control pill and vaping, likely provoked, although inherited thrombophilia or antiphospholipid syndrome is not ruled  out.  I recommend 6 months of anticoagulation, and obtain hypercoagulopathy work-up after she stops. I will be happy to f/u her in my clinic.   She also has moderate anemia, anemia work up showed severe iron deficiency, B12 deficiency and low folic acid.  I recommend iv feraheme once dose in hospital, and B12 injection once. She can start oral iron and B12 1015mg daily, or at least prenatal MVI after discharge. She will likely have menorrhagia from anticoagulation, which will make her iron deficient anemia worse.  I will follow-up her anemia issue in my office.  YTruitt Merle 03/30/2021

## 2021-03-30 NOTE — ED Notes (Signed)
Pt resting on stretcher with eyes closed, respirations even and unlabored. Pt has call bell within reach, side rails up x2, lights off. IVF infusing without difficulty. Mom at bedside. No acute changes noted. Will continue to monitor.

## 2021-03-30 NOTE — Progress Notes (Addendum)
STROKE TEAM PROGRESS NOTE   INTERVAL HISTORY No acute events Tammy Weber at bedside Tammy Weber  was seen in the ED 9/27 and diagnosed with CVST and started on SQ LWMH 100mg  Q24 hours x 5 days for the acute period followed by Pradaxa 150mg  BID long term and discharged home.  She reports she returned to the hospital yesterday due to feeling worse for the past two days after discharge. She recalls more intense headache, feeling more "fuzzy" thinking on 9/28 then yesterday began to notice hearing impairment on the left which she describes as muffled like she was underwater. She took the heparin as prescribed at home with Pradaxa start not yet due per her discharge instructions. She confirms again that she was taking BCPs prior to her initial visit to ER but has not taken them since then. She does vape infrequently. She had the Pfizer vaccine 10/11/2019 10/28. Patient and father deny any family history of clots or clotting disorder. Her repeat imaging shows possible progression of the thrombus.  We discussed her history, ongoing work up and plan of care including treatment options of IR angiogram/intervention. She elected to not pursue IR at this time. She was advised to continue to avoid BCPs and to stop vaping. She agrees to this.  Patient denies any prior history of DVT, pulmonary embolism, recent COVID infection or COVID vaccination.  No family history of strokes or MIs at a young age.  No history of sickle cell disease. Vitals:   03/30/21 0400 03/30/21 0500 03/30/21 0600 03/30/21 0700  BP: 139/84 133/90 140/79 116/79  Pulse: 60 68 (!) 56 68  Resp: 16 16 14 16   Temp:      TempSrc:      SpO2: 96% 100% 100% 100%   CBC:  Recent Labs  Lab 03/27/21 1305 03/29/21 1724 03/30/21 0414  WBC 6.6 7.2 6.3  NEUTROABS 4.5 4.2  --   HGB 9.3* 9.8* 8.5*  HCT 32.9* 35.3* 30.7*  MCV 68.0* 69.2* 68.8*  PLT 242 298 224   Basic Metabolic Panel:  Recent Labs  Lab 03/29/21 1724 03/30/21 0414  NA 138 137  K 4.1 4.1   CL 104 108  CO2 24 21*  GLUCOSE 99 94  BUN 7 5*  CREATININE 0.91 0.77  CALCIUM 9.5 8.9   Lipid Panel: No results for input(s): CHOL, TRIG, HDL, CHOLHDL, VLDL, LDLCALC in the last 168 hours. HgbA1c: No results for input(s): HGBA1C in the last 168 hours. Urine Drug Screen: No results for input(s): LABOPIA, COCAINSCRNUR, LABBENZ, AMPHETMU, THCU, LABBARB in the last 168 hours.  Alcohol Level No results for input(s): ETH in the last 168 hours.  IMAGING past 24 hours MR BRAIN WO CONTRAST  Addendum Date: 03/29/2021   ADDENDUM REPORT: 03/29/2021 21:08 ADDENDUM: Correction to the comparison: The prior CTV was on 03/27/2021, not the same day. While comparison across modalities is difficult, the extent of thrombus may be slightly progressed in comparison to that study. Findings in the report and this addendum discussed with Dr. 03/31/2021 via telephone at 9:01 p.m. Electronically Signed   By: 03/31/2021 M.D.   On: 03/29/2021 21:08   Result Date: 03/29/2021 CLINICAL DATA:  Headache, new or worsening, neuro deficit (Age 51-49y) EXAM: MRI HEAD WITHOUT AND WITH CONTRAST MRV HEAD WITHOUT AND WITH CONTRAST TECHNIQUE: Multiplanar, multi-echo pulse sequences of the brain and surrounding structures were acquired without and with intravenous contrast. Angiographic images of the intracranial venous structures were acquired using MRV technique without and with intravenous contrast.  CONTRAST:  CONTRAST 7.5 ML OF GADAVIST IV. COMPARISON:  Same day CTV FINDINGS: MRI HEAD WITHOUT CONTRAST Brain: No acute infarction, hemorrhage, hydrocephalus, extra-axial collection or mass lesion. No brain edema. No abnormal enhancement. Vascular: Major arterial flow voids are maintained at the skull base. Dural venous sinuses are assess below. Skull and upper cervical spine: Normal marrow signal. Sinuses/Orbits: Clear paranasal sinuses.  Unremarkable orbits. Other: Small right mastoid effusion. MR VENOGRAM WITHOUT CONTRAST Positive for  occlusive thrombus within the right transverse and sigmoid sinuses. Thrombus extends into the right jugular bulb and upper internal jugular vein. Some thrombus extends into adjacent draining cortical veins. The superior sagittal sinus, left transverse sinus, left sigmoid sinus and left jugular bulb are patent. The straight sinus and visualized deep cerebral veins are patent. Symmetric opacification of the cavernous sinuses. IMPRESSION: 1. Positive for occlusive thrombus within the right transverse and sigmoid sinuses. Thrombus extends into the right jugular bulb and upper internal jugular vein. No evidence of brain edema or venous infarct. 2. Small right mastoid effusion. Electronically Signed: By: Feliberto Harts M.D. On: 03/29/2021 20:51   MR MRV HEAD W WO CONTRAST  Addendum Date: 03/29/2021   ADDENDUM REPORT: 03/29/2021 21:08 ADDENDUM: Correction to the comparison: The prior CTV was on 03/27/2021, not the same day. While comparison across modalities is difficult, the extent of thrombus may be slightly progressed in comparison to that study. Findings in the report and this addendum discussed with Dr. Ezzie Dural via telephone at 9:01 p.m. Electronically Signed   By: Feliberto Harts M.D.   On: 03/29/2021 21:08   Result Date: 03/29/2021 CLINICAL DATA:  Headache, new or worsening, neuro deficit (Age 43-49y) EXAM: MRI HEAD WITHOUT AND WITH CONTRAST MRV HEAD WITHOUT AND WITH CONTRAST TECHNIQUE: Multiplanar, multi-echo pulse sequences of the brain and surrounding structures were acquired without and with intravenous contrast. Angiographic images of the intracranial venous structures were acquired using MRV technique without and with intravenous contrast. CONTRAST:  CONTRAST 7.5 ML OF GADAVIST IV. COMPARISON:  Same day CTV FINDINGS: MRI HEAD WITHOUT CONTRAST Brain: No acute infarction, hemorrhage, hydrocephalus, extra-axial collection or mass lesion. No brain edema. No abnormal enhancement. Vascular: Major arterial  flow voids are maintained at the skull base. Dural venous sinuses are assess below. Skull and upper cervical spine: Normal marrow signal. Sinuses/Orbits: Clear paranasal sinuses.  Unremarkable orbits. Other: Small right mastoid effusion. MR VENOGRAM WITHOUT CONTRAST Positive for occlusive thrombus within the right transverse and sigmoid sinuses. Thrombus extends into the right jugular bulb and upper internal jugular vein. Some thrombus extends into adjacent draining cortical veins. The superior sagittal sinus, left transverse sinus, left sigmoid sinus and left jugular bulb are patent. The straight sinus and visualized deep cerebral veins are patent. Symmetric opacification of the cavernous sinuses. IMPRESSION: 1. Positive for occlusive thrombus within the right transverse and sigmoid sinuses. Thrombus extends into the right jugular bulb and upper internal jugular vein. No evidence of brain edema or venous infarct. 2. Small right mastoid effusion. Electronically Signed: By: Feliberto Harts M.D. On: 03/29/2021 20:51    PHYSICAL EXAM  Temp:  [97.8 F (36.6 C)-99.3 F (37.4 C)] 98 F (36.7 C) (09/30 0833) Pulse Rate:  [56-99] 73 (09/30 1100) Resp:  [14-17] 16 (09/30 1000) BP: (116-148)/(73-100) 135/73 (09/30 1100) SpO2:  [96 %-100 %] 100 % (09/30 1100) Weight:  [71.4 kg] 71.4 kg (09/30 0833)  General - Well nourished, well developed, in no apparent distress.  Ophthalmologic -funduscopic exam shows sharp disc margins bilaterally.  No  papilledema  Cardiovascular - Regular rhythm and rate.  Mental Status -  Level of arousal and orientation to time, place, and person were intact. Language including expression, naming, repetition, comprehension was assessed and found intact. Attention span and concentration were normal. Recent and remote memory were intact. Fund of Knowledge was assessed and was intact.  Cranial Nerves II - XII - II - Visual field intact OU. III, IV, VI - Extraocular movements  intact. V - Facial sensation intact bilaterally. VII - Facial movement intact bilaterally. VIII - Hearing & vestibular intact bilaterally. X - Palate elevates symmetrically. XI - Chin turning & shoulder shrug intact bilaterally. XII - Tongue protrusion intact.  Motor Strength - The patient's strength was normal in all extremities and pronator drift was absent.  Bulk was normal and fasciculations were absent.   Motor Tone - Muscle tone was assessed at the neck and appendages and was normal.  Reflexes - The patient's reflexes were symmetrical in all extremities and she had no pathological reflexes.  Sensory - Light touch, temperature/pinprick were assessed and were symmetrical.    Coordination - The patient had normal movements in the hands and feet with no ataxia or dysmetria.  Tremor was absent.  Gait and Station - deferred.   ASSESSMENT/PLAN Tammy Weber is a 20 y.o. female with no significant PMH who presents with worsening headache and muffled hearing. She has had Right sided throbbing headache going mostly in the temple and back for the last 3 weeks with associated phonophobia for 3 weeks now. She was seen in the ED 2 days ago and found to have R sigmoid and transverse sinus thrombosis. She was started on SQ LWMH 100mg  Q24 hours x 5 days for the acute period followed by Pradaxa 150mg  BID long term and discharged home. She denies any blurred vision, endorses muffled hearing. She denies any ear pain. Denies any fever, no tinnitus, no ear discharge. No history of misscarriages, no significant EtOH use, vapes but does not smoke tobacco, no recreational drugs. 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  Right Transverse and Sigmoid SinusThrombosis with unclear source.  Only risk factor is birth control pills  CT Venogram 9/27 Right transverse and sigmoid sinus thrombosis without evidence of venous infarct. MRI/MRV  Brain 9/29 While  comparison across modalities is difficult, the extent of thrombus may be slightly progressed in comparison to that study VTE prophylaxis - on treatment dose heparin     Diet   Diet regular Room service appropriate? Yes; Fluid consistency: Thin   ON LMWH prior to admission x about 48 hours Now will transition to heparin drip per pharmacy management then consider Eliquis at discharge Continue MIVF for now Discussed need to avoid dehydration and encouraged 8 glasses of H20 per day once discharged.  Therapy recommendations:  n/a no acute deficits Disposition:  home   BP management  Home meds:  n/a Stable Long-term BP goal normotensive  Other Stroke Risk Factors BCPs-discontinued Counseled to quit Vaping, she agrees   Other Active Problems   Hospital day # 0 I have personally obtained history,examined this patient, reviewed notes, independently viewed imaging studies, participated in medical decision making and plan of care.ROS completed by me personally and pertinent positives fully documented  I have made any additions or clarifications directly to the above note. Agree with note above.  Patient has presented with right ear discomfort and hearing loss as well as mild headache secondary to  right transverse sinus and sigmoid sinus thrombosis probably triggered by being on birth control pills.  Her symptoms are gotten worse and on Lovenox for 5 days hence recommend discontinue it and start IV heparin.  Aggressive hydration.  Patient counseled to quit birth control pills and use alternative and just contraceptions medications after discussion with this.  Start Eliquis and if stable consider discharge tomorrow.  She will need anticoagulation for at least 6 to 9 months.  Patient advised to use birth control precautions during this time.  Also discussed with patient and father possibility of mechanical thrombectomy to decrease clot burden but at the present time they declined but in case she has  neurological worsening they will reconsider.  Discussed with Dr. Sherlon Handing interventional neuroradiologist on-call.  Discussed with Dr. Sharl Ma.  Greater than 50% time during this 35-minute visit with counseling and coordination of care discussion with care team and answering questions.  Delia Heady, MD Medical Director [facility] Pager: [phone] 03/30/2021 4:24 PM    To contact Stroke Continuity provider, please refer to [email or web address]. After hours, contact General Neurology

## 2021-03-30 NOTE — Progress Notes (Signed)
ANTICOAGULATION CONSULT NOTE - Initial Consult  Pharmacy Consult for Heparin Indication: Cerebral Venous Sinus Thrombosis  Allergies  Allergen Reactions   Latex Itching    Patient Measurements: Weight: 71.4 kg (157 lb 6.5 oz) Heparin Dosing Weight: 71 kg  Vital Signs: Temp: 98 F (36.7 C) (09/30 0833) Temp Source: Oral (09/30 0833) BP: 147/91 (09/30 0833) Pulse Rate: 87 (09/30 0833)  Labs: Recent Labs    03/27/21 1305 03/29/21 1724 03/29/21 2237 03/30/21 0414  HGB 9.3* 9.8*  --  8.5*  HCT 32.9* 35.3*  --  30.7*  PLT 242 298  --  224  HEPRLOWMOCWT  --   --  1.07  --   CREATININE 0.82 0.91  --  0.77    Estimated Creatinine Clearance: 114.1 mL/min (by C-G formula based on SCr of 0.77 mg/dL).   Medical History: Past Medical History:  Diagnosis Date   Vaccine for human papilloma virus (HPV) types 6, 11, 16, and 18 administered     Medications:  Lovenox 100 mg SQ daily - last dose 9/29 @ 1730  Assessment: 46 YOF recently diagnosed with cerebral sinus venous thrombosis on 9/27 and discharged with plans for a lovenox bridge for 5 days followed by oral dabigatran. The patient represented on 9/29 with worsening headache and concern for worsening. The plan is now to transition anticoagulation to heparin for monitoring - then possible apixaban on 10/1.   The patient last received Lovenox on 9/29 @ 1730, a LMWH level on 9/29 evening (~5 hours after dose) was therapeutic at 1.07 - will plan to transition to Heparin ~24h after the last lovenox dose - discussed with stroke team Pearlean Brownie).   Goal of Therapy:  Heparin level 0.3-0.5 units/ml Monitor platelets by anticoagulation protocol: Yes   Plan:  - Start Heparin at a rate of 1000 units/hr (10 ml/hr) starting at 1730 today - Will continue to monitor for any signs/symptoms of bleeding and will follow up with heparin level in 6 hours   Thank you for allowing pharmacy to be a part of this patient's care.  Georgina Pillion,  PharmD, BCPS Clinical Pharmacist Clinical phone for 03/30/2021: [phone] 03/30/2021 10:01 AM   **Pharmacist phone directory can now be found on [email or web address]).  Listed under [facility].

## 2021-03-30 NOTE — Progress Notes (Signed)
[facility]  PROGRESS NOTE  Tammy Weber JJO:[id number] DOB: 27-Apr-2001 DOA: 03/29/2021 PCP: Patient, No Pcp Per (Inactive)   Brief HPI:    20 year old female with recent diagnosis of cerebral venous sinus thrombosis 2 days ago was started on low molecular weight heparin along with Pradaxa for 5 days came to ER with worsening headache and muffled hearing.  2 days ago patient came with 3 weeks history of headache and right temporal area, CT venogram showed right sigmoid and transverse venous sinus thrombosis.  She was sent home on low molecular weight heparin bridging with Pradaxa for 5 days.  Patient was on birth control pills containing estrogen which was discontinued on March 27, 2021, when she was initially diagnosed with thrombosis.  In the ER MRI of the brain along with MRV shows features concerning for possible progression with thrombus becoming more occlusive.  Patient's low medical weight heparin level was found to be therapeutic at 1.07.  Neurologist was consulted.    Subjective   Patient seen and examined, says headache has improved.   Assessment/Plan:    Cerebral venous sinus thrombosis -MRV done last night showed possible progression of thrombus with possible occlusion -Neurology consulted -We will start heparin per pharmacy -Also consulted with hematology to help with choice of anticoagulation  Microcytic anemia -Check anemia panel    Data Reviewed:   CBG:  No results for input(s): GLUCAP in the last 168 hours.  SpO2: 100 %    Vitals:   03/30/21 0700 03/30/21 0833 03/30/21 1000 03/30/21 1100  BP: 116/79 (!) 147/91 (!) 148/87 135/73  Pulse: 68 87 90 73  Resp: 16 17 16    Temp:  98 F (36.7 C)    TempSrc:  Oral    SpO2: 100% 100% 96% 100%  Weight:  71.4 kg       Intake/Output Summary (Last 24 hours) at 03/30/2021 1256 Last data filed at 03/30/2021 0005 Gross per 24 hour  Intake 500 ml  Output --  Net 500 ml    09/28 1901 - 09/30  0700 In: 500  Out: -   Filed Weights   03/30/21 0833  Weight: 71.4 kg    Data Reviewed: Basic Metabolic Panel: Recent Labs  Lab 03/27/21 1305 03/29/21 1724 03/30/21 0414  NA 134* 138 137  K 3.9 4.1 4.1  CL 103 104 108  CO2 22 24 21*  GLUCOSE 95 99 94  BUN 6 7 5*  CREATININE 0.82 0.91 0.77  CALCIUM 9.1 9.5 8.9   Liver Function Tests: Recent Labs  Lab 03/27/21 1305 03/29/21 1724  AST 15 16  ALT 11 12  ALKPHOS 63 58  BILITOT 0.8 0.2*  PROT 6.8 7.5  ALBUMIN 3.7 3.9   No results for input(s): LIPASE, AMYLASE in the last 168 hours. No results for input(s): AMMONIA in the last 168 hours. CBC: Recent Labs  Lab 03/27/21 1305 03/29/21 1724 03/30/21 0414  WBC 6.6 7.2 6.3  NEUTROABS 4.5 4.2  --   HGB 9.3* 9.8* 8.5*  HCT 32.9* 35.3* 30.7*  MCV 68.0* 69.2* 68.8*  PLT 242 298 224   Cardiac Enzymes: No results for input(s): CKTOTAL, CKMB, CKMBINDEX, TROPONINI in the last 168 hours. BNP (last 3 results) No results for input(s): BNP in the last 8760 hours.  ProBNP (last 3 results) No results for input(s): PROBNP in the last 8760 hours.  CBG: No results for input(s): GLUCAP in the last 168 hours.  Recent Results (from the past 240 hour(s))  Resp  Panel by RT-PCR (Flu A&B, Covid) Nasopharyngeal Swab     Status: None   Collection Time: 03/29/21 11:07 PM   Specimen: Nasopharyngeal Swab; Nasopharyngeal(NP) swabs in vial transport medium  Result Value Ref Range Status   SARS Coronavirus 2 by RT PCR NEGATIVE NEGATIVE Final    Comment: (NOTE) SARS-CoV-2 target nucleic acids are NOT DETECTED.  The SARS-CoV-2 RNA is generally detectable in upper respiratory specimens during the acute phase of infection. The lowest concentration of SARS-CoV-2 viral copies this assay can detect is 138 copies/mL. A negative result does not preclude SARS-Cov-2 infection and should not be used as the sole basis for treatment or other patient management decisions. A negative result may occur  with  improper specimen collection/handling, submission of specimen other than nasopharyngeal swab, presence of viral mutation(s) within the areas targeted by this assay, and inadequate number of viral copies(<138 copies/mL). A negative result must be combined with clinical observations, patient history, and epidemiological information. The expected result is Negative.  Fact Sheet for Patients:  BloggerCourse.com  Fact Sheet for Healthcare Providers:  SeriousBroker.it  This test is no t yet approved or cleared by the [place] FDA and  has been authorized for detection and/or diagnosis of SARS-CoV-2 by FDA under an Emergency Use Authorization (EUA). This EUA will remain  in effect (meaning this test can be used) for the duration of the COVID-19 declaration under Section 564(b)(1) of the Act, 21 U.S.C.section 360bbb-3(b)(1), unless the authorization is terminated  or revoked sooner.       Influenza A by PCR NEGATIVE NEGATIVE Final   Influenza B by PCR NEGATIVE NEGATIVE Final    Comment: (NOTE) The Xpert Xpress SARS-CoV-2/FLU/RSV plus assay is intended as an aid in the diagnosis of influenza from Nasopharyngeal swab specimens and should not be used as a sole basis for treatment. Nasal washings and aspirates are unacceptable for Xpert Xpress SARS-CoV-2/FLU/RSV testing.  Fact Sheet for Patients: BloggerCourse.com  Fact Sheet for Healthcare Providers: SeriousBroker.it  This test is not yet approved or cleared by the [place] FDA and has been authorized for detection and/or diagnosis of SARS-CoV-2 by FDA under an Emergency Use Authorization (EUA). This EUA will remain in effect (meaning this test can be used) for the duration of the COVID-19 declaration under Section 564(b)(1) of the Act, 21 U.S.C. section 360bbb-3(b)(1), unless the authorization is terminated  or revoked.  Performed at [facility], [address] [place], [place] [address]      Radiology Reports  MR BRAIN WO CONTRAST  Addendum Date: 03/29/2021   ADDENDUM REPORT: 03/29/2021 21:08 ADDENDUM: Correction to the comparison: The prior CTV was on 03/27/2021, not the same day. While comparison across modalities is difficult, the extent of thrombus may be slightly progressed in comparison to that study. Findings in the report and this addendum discussed with Dr. Ezzie Dural via telephone at 9:01 p.m. Electronically Signed   By: Feliberto Harts M.D.   On: 03/29/2021 21:08   Result Date: 03/29/2021 CLINICAL DATA:  Headache, new or worsening, neuro deficit (Age 30-49y) EXAM: MRI HEAD WITHOUT AND WITH CONTRAST MRV HEAD WITHOUT AND WITH CONTRAST TECHNIQUE: Multiplanar, multi-echo pulse sequences of the brain and surrounding structures were acquired without and with intravenous contrast. Angiographic images of the intracranial venous structures were acquired using MRV technique without and with intravenous contrast. CONTRAST:  CONTRAST 7.5 ML OF GADAVIST IV. COMPARISON:  Same day CTV FINDINGS: MRI HEAD WITHOUT CONTRAST Brain: No acute infarction, hemorrhage, hydrocephalus, extra-axial collection or  mass lesion. No brain edema. No abnormal enhancement. Vascular: Major arterial flow voids are maintained at the skull base. Dural venous sinuses are assess below. Skull and upper cervical spine: Normal marrow signal. Sinuses/Orbits: Clear paranasal sinuses.  Unremarkable orbits. Other: Small right mastoid effusion. MR VENOGRAM WITHOUT CONTRAST Positive for occlusive thrombus within the right transverse and sigmoid sinuses. Thrombus extends into the right jugular bulb and upper internal jugular vein. Some thrombus extends into adjacent draining cortical veins. The superior sagittal sinus, left transverse sinus, left sigmoid sinus and left jugular bulb are patent. The straight sinus and visualized deep cerebral  veins are patent. Symmetric opacification of the cavernous sinuses. IMPRESSION: 1. Positive for occlusive thrombus within the right transverse and sigmoid sinuses. Thrombus extends into the right jugular bulb and upper internal jugular vein. No evidence of brain edema or venous infarct. 2. Small right mastoid effusion. Electronically Signed: By: Feliberto Harts M.D. On: 03/29/2021 20:51   MR MRV HEAD W WO CONTRAST  Addendum Date: 03/29/2021   ADDENDUM REPORT: 03/29/2021 21:08 ADDENDUM: Correction to the comparison: The prior CTV was on 03/27/2021, not the same day. While comparison across modalities is difficult, the extent of thrombus may be slightly progressed in comparison to that study. Findings in the report and this addendum discussed with Dr. Ezzie Dural via telephone at 9:01 p.m. Electronically Signed   By: Feliberto Harts M.D.   On: 03/29/2021 21:08   Result Date: 03/29/2021 CLINICAL DATA:  Headache, new or worsening, neuro deficit (Age 17-49y) EXAM: MRI HEAD WITHOUT AND WITH CONTRAST MRV HEAD WITHOUT AND WITH CONTRAST TECHNIQUE: Multiplanar, multi-echo pulse sequences of the brain and surrounding structures were acquired without and with intravenous contrast. Angiographic images of the intracranial venous structures were acquired using MRV technique without and with intravenous contrast. CONTRAST:  CONTRAST 7.5 ML OF GADAVIST IV. COMPARISON:  Same day CTV FINDINGS: MRI HEAD WITHOUT CONTRAST Brain: No acute infarction, hemorrhage, hydrocephalus, extra-axial collection or mass lesion. No brain edema. No abnormal enhancement. Vascular: Major arterial flow voids are maintained at the skull base. Dural venous sinuses are assess below. Skull and upper cervical spine: Normal marrow signal. Sinuses/Orbits: Clear paranasal sinuses.  Unremarkable orbits. Other: Small right mastoid effusion. MR VENOGRAM WITHOUT CONTRAST Positive for occlusive thrombus within the right transverse and sigmoid sinuses. Thrombus  extends into the right jugular bulb and upper internal jugular vein. Some thrombus extends into adjacent draining cortical veins. The superior sagittal sinus, left transverse sinus, left sigmoid sinus and left jugular bulb are patent. The straight sinus and visualized deep cerebral veins are patent. Symmetric opacification of the cavernous sinuses. IMPRESSION: 1. Positive for occlusive thrombus within the right transverse and sigmoid sinuses. Thrombus extends into the right jugular bulb and upper internal jugular vein. No evidence of brain edema or venous infarct. 2. Small right mastoid effusion. Electronically Signed: By: Feliberto Harts M.D. On: 03/29/2021 20:51     Scheduled medications:    Antibiotics: Anti-infectives (From admission, onward)    None         DVT prophylaxis: Full dose heparin  Code Status: Full code  Family Communication: No family at bedside   Consultants: Neurology  Procedures:     Objective    Physical Examination:   General-appears in no acute distress Heart-S1-S2, regular, no murmur auscultated Lungs-clear to auscultation bilaterally, no wheezing or crackles auscultated Abdomen-soft, nontender, no organomegaly Extremities-no edema in the lower extremities Neuro-alert, oriented x3, no focal deficit noted   Status is: Inpatient  Dispo: The  patient is from: Home              Anticipated d/c is to: Home              Anticipated d/c date is: 04/02/2021              Patient currently not stable for discharge  Barrier to discharge-ongoing treatment for cerebral venous sinus thrombosis  COVID-19 Labs  No results for input(s): DDIMER, FERRITIN, LDH, CRP in the last 72 hours.  Lab Results  Component Value Date   SARSCOV2NAA NEGATIVE 03/29/2021         Meredeth Ide   [facility] If 7PM-7AM, please contact night-coverage at [email or web address], Office  [phone]   03/30/2021, 12:56 PM  LOS: 0 days

## 2021-03-30 NOTE — ED Notes (Signed)
Pt ambulatory from the bathroom, pt denies pain, states that she had some fullness in her R ear starting about 20 minutes ago, pt has no facial droop, no slurred speech, no weakness

## 2021-03-30 NOTE — ED Notes (Addendum)
Pt ambulatory to the bathroom and back to stretcher without assistance. No acute changes noted. Will continue to monitor.  °

## 2021-03-30 NOTE — Progress Notes (Signed)
Patient arrived to 3w 16

## 2021-03-31 DIAGNOSIS — D519 Vitamin B12 deficiency anemia, unspecified: Secondary | ICD-10-CM

## 2021-03-31 LAB — CBC
HCT: 31.4 % — ABNORMAL LOW (ref 36.0–46.0)
Hemoglobin: 9.1 g/dL — ABNORMAL LOW (ref 12.0–15.0)
MCH: 19.7 pg — ABNORMAL LOW (ref 26.0–34.0)
MCHC: 29 g/dL — ABNORMAL LOW (ref 30.0–36.0)
MCV: 68 fL — ABNORMAL LOW (ref 80.0–100.0)
Platelets: 225 10*3/uL (ref 150–400)
RBC: 4.62 MIL/uL (ref 3.87–5.11)
RDW: 17.8 % — ABNORMAL HIGH (ref 11.5–15.5)
WBC: 5.6 10*3/uL (ref 4.0–10.5)
nRBC: 0 % (ref 0.0–0.2)

## 2021-03-31 LAB — LIPID PANEL
Cholesterol: 151 mg/dL (ref 0–200)
HDL: 34 mg/dL — ABNORMAL LOW (ref >40)
LDL Cholesterol: 86 mg/dL (ref 0–99)
Total CHOL/HDL Ratio: 4.4 RATIO
Triglycerides: 157 mg/dL — ABNORMAL HIGH (ref <150)
VLDL: 31 mg/dL (ref 0–40)

## 2021-03-31 LAB — HEPARIN LEVEL (UNFRACTIONATED)
Heparin Unfractionated: 0.24 IU/mL — ABNORMAL LOW (ref 0.30–0.70)
Heparin Unfractionated: 0.39 IU/mL (ref 0.30–0.70)

## 2021-03-31 LAB — HEMOGLOBIN A1C
Hgb A1c MFr Bld: 5.1 % (ref 4.8–5.6)
Mean Plasma Glucose: 99.67 mg/dL

## 2021-03-31 LAB — TSH: TSH: 2.345 u[IU]/mL (ref 0.350–4.500)

## 2021-03-31 MED ORDER — CYANOCOBALAMIN 1000 MCG PO TABS: 1000.0000 ug | ORAL_TABLET | Freq: Every day | ORAL | 3 refills | Status: DC

## 2021-03-31 MED ORDER — APIXABAN 5 MG PO TABS: 5.0000 mg | ORAL_TABLET | Freq: Two times a day (BID) | ORAL | Status: DC

## 2021-03-31 MED ORDER — FOLIC ACID 1 MG PO TABS: 1.0000 mg | ORAL_TABLET | Freq: Every day | ORAL | 0 refills | Status: AC

## 2021-03-31 MED ORDER — FERROUS GLUCONATE 324 (38 FE) MG PO TABS: 324.0000 mg | ORAL_TABLET | Freq: Every day | ORAL | 3 refills | Status: DC

## 2021-03-31 MED ORDER — APIXABAN 5 MG PO TABS
10.0000 mg | ORAL_TABLET | Freq: Two times a day (BID) | ORAL | 4 refills | Status: DC
Start: 2021-03-31 — End: 2021-04-02

## 2021-03-31 MED ORDER — APIXABAN 5 MG PO TABS
10.0000 mg | ORAL_TABLET | Freq: Two times a day (BID) | ORAL | Status: DC
Start: 2021-03-31 — End: 2021-03-31
  Administered 2021-03-31: 10 mg via ORAL
  Filled 2021-03-31: qty 2

## 2021-03-31 NOTE — Discharge Instructions (Signed)
Information on my medicine - ELIQUIS (apixaban)  This medication education was reviewed with me or my healthcare representative as part of my discharge preparation.    Why was Eliquis prescribed for you? Eliquis was prescribed to treat blood clots that may have been found in the veins of your legs (deep vein thrombosis) or in your lungs (pulmonary embolism) and to reduce the risk of them occurring again.  What do You need to know about Eliquis ? The starting dose is 10 mg (two 5 mg tablets) taken TWICE daily for the FIRST SEVEN (7) DAYS, then on 04/07/21  the dose is reduced to ONE 5 mg tablet taken TWICE daily.  Eliquis may be taken with or without food.   Try to take the dose about the same time in the morning and in the evening. If you have difficulty swallowing the tablet whole please discuss with your pharmacist how to take the medication safely.  Take Eliquis exactly as prescribed and DO NOT stop taking Eliquis without talking to the doctor who prescribed the medication.  Stopping may increase your risk of developing a new blood clot.  Refill your prescription before you run out.  After discharge, you should have regular check-up appointments with your healthcare provider that is prescribing your Eliquis.    What do you do if you miss a dose? If a dose of ELIQUIS is not taken at the scheduled time, take it as soon as possible on the same day and twice-daily administration should be resumed. The dose should not be doubled to make up for a missed dose.  Important Safety Information A possible side effect of Eliquis is bleeding. You should call your healthcare provider right away if you experience any of the following: Bleeding from an injury or your nose that does not stop. Unusual colored urine (red or dark brown) or unusual colored stools (red or black). Unusual bruising for unknown reasons. A serious fall or if you hit your head (even if there is no bleeding).  Some  medicines may interact with Eliquis and might increase your risk of bleeding or clotting while on Eliquis. To help avoid this, consult your healthcare provider or pharmacist prior to using any new prescription or non-prescription medications, including herbals, vitamins, non-steroidal anti-inflammatory drugs (NSAIDs) and supplements.  This website has more information on Eliquis (apixaban): http://www.eliquis.com/eliquis/home

## 2021-03-31 NOTE — Progress Notes (Signed)
Tammy Weber Score to be D/C'd home per MD order. Discussed with the patient and father and all questions fully answered.  Skin clean, dry and intact without evidence of skin break down, no evidence of skin tears noted.  IV catheter discontinued intact. Site without signs and symptoms of complications. Dressing and pressure applied.  An After Visit Summary was printed and given to the patient.  Patient escorted via WC, and D/C home via private auto.  Jon Gills

## 2021-03-31 NOTE — Progress Notes (Signed)
ANTICOAGULATION CONSULT NOTE - Follow Up Consult  Pharmacy Consult for heparin Indication:  cerebral venous sinus thrombosis  Labs: Recent Labs    03/29/21 1724 03/29/21 2237 03/30/21 0414 03/31/21 0014  HGB 9.8*  --  8.5* 9.1*  HCT 35.3*  --  30.7* 31.4*  PLT 298  --  224 225  HEPARINUNFRC  --   --   --  0.24*  HEPRLOWMOCWT  --  1.07  --   --   CREATININE 0.91  --  0.77  --     Assessment: 20yo female subtherapeutic on heparin with initial dosing after transitioning from LMWH; no infusion issues or signs of bleeding per RN.  Goal of Therapy:  Heparin level 0.3-0.5 units/ml   Plan:  Will increase heparin infusion by 2 units/kg/hr to 1150 units/hr and check level in 6 hours.    Vernard Gambles, PharmD, BCPS  03/31/2021,1:17 AM

## 2021-03-31 NOTE — Progress Notes (Addendum)
ANTICOAGULATION CONSULT NOTE - Follow Up Consult  Pharmacy Consult for Heparin > to Eliquis Indication:  cerebral venous sinus thrombosis  Allergies  Allergen Reactions   Latex Itching    Patient Measurements: Height: 5\' 8"  (172.7 cm) Weight: 73.1 kg (161 lb 2.5 oz) IBW/kg (Calculated) : 63.9 Heparin Dosing Weight: 73 kg  Vital Signs: Temp: 98.4 F (36.9 C) (10/01 0844) Temp Source: Oral (10/01 0844) BP: 121/78 (10/01 0844) Pulse Rate: 77 (10/01 0844)  Labs: Recent Labs    03/29/21 1724 03/29/21 2237 03/30/21 0414 03/31/21 0014 03/31/21 0719  HGB 9.8*  --  8.5* 9.1*  --   HCT 35.3*  --  30.7* 31.4*  --   PLT 298  --  224 225  --   HEPARINUNFRC  --   --   --  0.24* 0.39  HEPRLOWMOCWT  --  1.07  --   --   --   CREATININE 0.91  --  0.77  --   --     Estimated Creatinine Clearance: 114.1 mL/min (by C-G formula based on SCr of 0.77 mg/dL).  Assessment: 11 YOF recently diagnosed with cerebral sinus venous thrombosis on 9/27 and discharged with plans for a lovenox bridge for 5 days followed by oral dabigatran. The patient represented on 9/29 with worsening headache and concern for worsening. The plan is now to transition anticoagulation to heparin for monitoring - then possible apixaban on 10/1.     Heparin drip begun 9/30 ~5:30pm, ~24hrs after last Lovenox dose.  Initial heparin level low on 1000 units/hr and rate increased. Heparin level is now therapeutic (0.39) on 1150 units/hr.  Goal of Therapy:  Heparin level 0.3-0.5 units/ml Monitor platelets by anticoagulation protocol: Yes   Plan:  Continue heparin drip at 1150 units/hr Daily heparin level and CBC while on heparin. Follow up for transition to oral anticoagulation.   10/30, RPh 03/31/2021,9:58 AM  Addendum:  To transition to Eliquis.   Begin Eliquis 10 mg PO BID x 7 days then 5 mg BID.   IV heparin to stop when giving first Eliquis dose.  05/31/2021, RPh 03/31/2021 11:45 AM

## 2021-03-31 NOTE — Discharge Summary (Addendum)
Physician Discharge Summary  Tammy Weber QQV:[id number] DOB: [birth year] DOA: 03/29/2021  PCP: Patient, No Pcp Per (Inactive)  Admit date: 03/29/2021 Discharge date: 03/31/2021  Time spent: 60 minutes  Recommendations for Outpatient Follow-up:  Follow-up Dr. Mosetta Putt in 2 weeks   Discharge Diagnoses:  Principal Problem:   Acute cerebral venous sinus thrombosis Active Problems:   Anemia   Cerebral venous thrombosis   Discharge Condition: Stable  Diet recommendation: Regular diet  Filed Weights   03/30/21 0833 03/31/21 0409  Weight: 71.4 kg 73.1 kg    History of present illness:   20 year old female with recent diagnosis of cerebral venous sinus thrombosis 2 days ago was started on low molecular weight heparin along with Pradaxa for 5 days came to ER with worsening headache and muffled hearing.  2 days ago patient came with 3 weeks history of headache and right temporal area, CT venogram showed right sigmoid and transverse venous sinus thrombosis.  She was sent home on low molecular weight heparin bridging with Pradaxa for 5 days.  Patient was on birth control pills containing estrogen which was discontinued on March 27, 2021, when she was initially diagnosed with thrombosis.   In the ER MRI of the brain along with MRV shows features concerning for possible progression with thrombus becoming more occlusive.  Patient's low medical weight heparin level was found to be therapeutic at 1.07.  Neurologist was consulted.    Hospital Course:   Cerebral venous sinus thrombosis -Likely provoked as patient was on oral contraceptive pills -Recommended to stop OCPs and use some other method of birth control -MRV done last night showed possible progression of thrombus with possible occlusion -Neurology consulted; mechanical thrombectomy was recommended however patient and family refused at this time. -Patient was started on heparin, plan to switch to Eliquis today.   -She was seen by  hematology, Dr. Mosetta Putt, she will see patient in the clinic   B12 deficiency -Anemia panel showed B12 level of 177 -1000 mcg subcutaneous dose x1 was given yesterday -Continue vitamin B12 1000 mcg p.o. daily  Iron-deficiency anemia -Patient was given Ferrlecit to 50 mg IV x2 doses -We will discharge on ferrous gluconate 1 tablet p.o. daily -Follow-up hematology as outpatient  Procedures:   Consultations: Neurology Hematology  Discharge Exam: Vitals:   03/31/21 0408 03/31/21 0844  BP: 108/63 121/78  Pulse: 72 77  Resp: 16 18  Temp: 98.2 F (36.8 C) 98.4 F (36.9 C)  SpO2: 100%     General: Appears in no acute distress Cardiovascular: S1-S2, regular, no murmur auscultated Respiratory: Clear to auscultation bilaterally  Discharge Instructions   Discharge Instructions     Diet - low sodium heart healthy   Complete by: As directed    Increase activity slowly   Complete by: As directed       Allergies as of 03/31/2021       Reactions   Latex Itching        Medication List     STOP taking these medications    dabigatran 150 MG Caps capsule Commonly known as: Pradaxa   enoxaparin 100 MG/ML injection Commonly known as: LOVENOX   ibuprofen 200 MG tablet Commonly known as: ADVIL   Junel Fe 24 1-20 MG-MCG(24) tablet Generic drug: Norethindrone Acetate-Ethinyl Estrad-FE   VITAMIN K2 PO       TAKE these medications    acetaminophen 500 MG tablet Commonly known as: TYLENOL Take 500 mg by mouth every 6 (six) hours as needed for moderate  pain or headache.   apixaban 5 MG Tabs tablet Commonly known as: ELIQUIS Take 2 tablets (10 mg total) by mouth 2 (two) times daily. For 7 days , Then take 5 mg po twice daily.   cyanocobalamin 1000 MCG tablet Take 1 tablet (1,000 mcg total) by mouth daily. Start taking on: April 01, 2021   ferrous gluconate 324 MG tablet Commonly known as: FERGON Take 1 tablet (324 mg total) by mouth daily with breakfast.    folic acid 1 MG tablet Commonly known as: FOLVITE Take 1 tablet (1 mg total) by mouth daily.   Magnesium 100 MG Caps Take 100 mg by mouth daily.   Vitamin D3 1.25 MG (50000 UT) Tabs Take 10,000 Units by mouth daily.       Allergies  Allergen Reactions   Latex Itching    Follow-up Information     Malachy Mood, MD Follow up in 2 week(s).   Specialties: Hematology, Oncology Contact information: [address] [place] [address] [phone]                  The results of significant diagnostics from this hospitalization (including imaging, microbiology, ancillary and laboratory) are listed below for reference.    Significant Diagnostic Studies: MR BRAIN WO CONTRAST  Addendum Date: 03/29/2021   ADDENDUM REPORT: 03/29/2021 21:08 ADDENDUM: Correction to the comparison: The prior CTV was on 03/27/2021, not the same day. While comparison across modalities is difficult, the extent of thrombus may be slightly progressed in comparison to that study. Findings in the report and this addendum discussed with Dr. Ezzie Dural via telephone at 9:01 p.m. Electronically Signed   By: Feliberto Harts M.D.   On: 03/29/2021 21:08   Result Date: 03/29/2021 CLINICAL DATA:  Headache, new or worsening, neuro deficit (Age 92-49y) EXAM: MRI HEAD WITHOUT AND WITH CONTRAST MRV HEAD WITHOUT AND WITH CONTRAST TECHNIQUE: Multiplanar, multi-echo pulse sequences of the brain and surrounding structures were acquired without and with intravenous contrast. Angiographic images of the intracranial venous structures were acquired using MRV technique without and with intravenous contrast. CONTRAST:  CONTRAST 7.5 ML OF GADAVIST IV. COMPARISON:  Same day CTV FINDINGS: MRI HEAD WITHOUT CONTRAST Brain: No acute infarction, hemorrhage, hydrocephalus, extra-axial collection or mass lesion. No brain edema. No abnormal enhancement. Vascular: Major arterial flow voids are maintained at the skull base. Dural venous  sinuses are assess below. Skull and upper cervical spine: Normal marrow signal. Sinuses/Orbits: Clear paranasal sinuses.  Unremarkable orbits. Other: Small right mastoid effusion. MR VENOGRAM WITHOUT CONTRAST Positive for occlusive thrombus within the right transverse and sigmoid sinuses. Thrombus extends into the right jugular bulb and upper internal jugular vein. Some thrombus extends into adjacent draining cortical veins. The superior sagittal sinus, left transverse sinus, left sigmoid sinus and left jugular bulb are patent. The straight sinus and visualized deep cerebral veins are patent. Symmetric opacification of the cavernous sinuses. IMPRESSION: 1. Positive for occlusive thrombus within the right transverse and sigmoid sinuses. Thrombus extends into the right jugular bulb and upper internal jugular vein. No evidence of brain edema or venous infarct. 2. Small right mastoid effusion. Electronically Signed: By: Feliberto Harts M.D. On: 03/29/2021 20:51   MR MRV HEAD W WO CONTRAST  Addendum Date: 03/29/2021   ADDENDUM REPORT: 03/29/2021 21:08 ADDENDUM: Correction to the comparison: The prior CTV was on 03/27/2021, not the same day. While comparison across modalities is difficult, the extent of thrombus may be slightly progressed in comparison to that study. Findings in  the report and this addendum discussed with Dr. Ezzie Dural via telephone at 9:01 p.m. Electronically Signed   By: Feliberto Harts M.D.   On: 03/29/2021 21:08   Result Date: 03/29/2021 CLINICAL DATA:  Headache, new or worsening, neuro deficit (Age 7-49y) EXAM: MRI HEAD WITHOUT AND WITH CONTRAST MRV HEAD WITHOUT AND WITH CONTRAST TECHNIQUE: Multiplanar, multi-echo pulse sequences of the brain and surrounding structures were acquired without and with intravenous contrast. Angiographic images of the intracranial venous structures were acquired using MRV technique without and with intravenous contrast. CONTRAST:  CONTRAST 7.5 ML OF GADAVIST IV.  COMPARISON:  Same day CTV FINDINGS: MRI HEAD WITHOUT CONTRAST Brain: No acute infarction, hemorrhage, hydrocephalus, extra-axial collection or mass lesion. No brain edema. No abnormal enhancement. Vascular: Major arterial flow voids are maintained at the skull base. Dural venous sinuses are assess below. Skull and upper cervical spine: Normal marrow signal. Sinuses/Orbits: Clear paranasal sinuses.  Unremarkable orbits. Other: Small right mastoid effusion. MR VENOGRAM WITHOUT CONTRAST Positive for occlusive thrombus within the right transverse and sigmoid sinuses. Thrombus extends into the right jugular bulb and upper internal jugular vein. Some thrombus extends into adjacent draining cortical veins. The superior sagittal sinus, left transverse sinus, left sigmoid sinus and left jugular bulb are patent. The straight sinus and visualized deep cerebral veins are patent. Symmetric opacification of the cavernous sinuses. IMPRESSION: 1. Positive for occlusive thrombus within the right transverse and sigmoid sinuses. Thrombus extends into the right jugular bulb and upper internal jugular vein. No evidence of brain edema or venous infarct. 2. Small right mastoid effusion. Electronically Signed: By: Feliberto Harts M.D. On: 03/29/2021 20:51   CT VENOGRAM HEAD  Result Date: 03/27/2021 CLINICAL DATA:  Dural venous sinus thrombosis suspected EXAM: CT VENOGRAM HEAD TECHNIQUE: Venographic phase images of the brain were obtained following the administration of intravenous contrast. Multiplanar reformats and maximum intensity projections were generated. CONTRAST:  56mL OMNIPAQUE IOHEXOL 350 MG/ML SOLN COMPARISON:  None. FINDINGS: Brain: There is no evidence of acute intracranial hemorrhage, extra-axial fluid collection, or acute infarct. The ventricles are normal in size. There is no mass lesion. There is no midline shift. Vascular: There is abnormal density in the right transverse sinus on the noncontrast images with  occlusive filling defect in the transverse sinus extending to the sigmoid sinus and jugular bulb on the venous phase post-contrast images. The left transverse and sigmoid sinuses are patent. The superior and inferior sagittal sinuses are patent. The internal cerebral veins and basal veins of Rosenthal are patent. The major intracranial arteries are patent. Skull: Normal. Negative for fracture or focal lesion. Sinuses/Orbits: The imaged paranasal sinuses are clear. The imaged globes and orbits are unremarkable. Other: None. 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 These results were called by telephone at the time of interpretation on 03/27/2021 at 2:49 pm to provider Pricilla Loveless , who verbally acknowledged these results. Electronically Signed   By: Lesia Hausen M.D.   On: 03/27/2021 14:53    Microbiology: Recent Results (from the past 240 hour(s))  Resp Panel by RT-PCR (Flu A&B, Covid) Nasopharyngeal Swab     Status: None   Collection Time: 03/29/21 11:07 PM   Specimen: Nasopharyngeal Swab; Nasopharyngeal(NP) swabs in vial transport medium  Result Value Ref Range Status   SARS Coronavirus 2 by RT PCR NEGATIVE NEGATIVE Final    Comment: (NOTE) SARS-CoV-2 target nucleic acids are NOT DETECTED.  The SARS-CoV-2 RNA is generally detectable in upper respiratory specimens during the  acute phase of infection. The lowest concentration of SARS-CoV-2 viral copies this assay can detect is 138 copies/mL. A negative result does not preclude SARS-Cov-2 infection and should not be used as the sole basis for treatment or other patient management decisions. A negative result may occur with  improper specimen collection/handling, submission of specimen other than nasopharyngeal swab, presence of viral mutation(s) within the areas targeted by this assay, and inadequate number of viral copies(<138 copies/mL). A negative result must be combined with clinical  observations, patient history, and epidemiological information. The expected result is Negative.  Fact Sheet for Patients:  BloggerCourse.com  Fact Sheet for Healthcare Providers:  SeriousBroker.it  This test is no t yet approved or cleared by the [place] FDA and  has been authorized for detection and/or diagnosis of SARS-CoV-2 by FDA under an Emergency Use Authorization (EUA). This EUA will remain  in effect (meaning this test can be used) for the duration of the COVID-19 declaration under Section 564(b)(1) of the Act, 21 U.S.C.section 360bbb-3(b)(1), unless the authorization is terminated  or revoked sooner.       Influenza A by PCR NEGATIVE NEGATIVE Final   Influenza B by PCR NEGATIVE NEGATIVE Final    Comment: (NOTE) The Xpert Xpress SARS-CoV-2/FLU/RSV plus assay is intended as an aid in the diagnosis of influenza from Nasopharyngeal swab specimens and should not be used as a sole basis for treatment. Nasal washings and aspirates are unacceptable for Xpert Xpress SARS-CoV-2/FLU/RSV testing.  Fact Sheet for Patients: BloggerCourse.com  Fact Sheet for Healthcare Providers: SeriousBroker.it  This test is not yet approved or cleared by the [place] FDA and has been authorized for detection and/or diagnosis of SARS-CoV-2 by FDA under an Emergency Use Authorization (EUA). This EUA will remain in effect (meaning this test can be used) for the duration of the COVID-19 declaration under Section 564(b)(1) of the Act, 21 U.S.C. section 360bbb-3(b)(1), unless the authorization is terminated or revoked.  Performed at [facility], [address], [place], [place] [address]      Labs: Basic Metabolic Panel: Recent Labs  Lab 03/27/21 1305 03/29/21 1724 03/30/21 0414  NA 134* 138 137  K 3.9 4.1 4.1  CL 103 104 108  CO2 22 24 21*  GLUCOSE 95 99 94  BUN 6 7  5*  CREATININE 0.82 0.91 0.77  CALCIUM 9.1 9.5 8.9   Liver Function Tests: Recent Labs  Lab 03/27/21 1305 03/29/21 1724  AST 15 16  ALT 11 12  ALKPHOS 63 58  BILITOT 0.8 0.2*  PROT 6.8 7.5  ALBUMIN 3.7 3.9   No results for input(s): LIPASE, AMYLASE in the last 168 hours. No results for input(s): AMMONIA in the last 168 hours. CBC: Recent Labs  Lab 03/27/21 1305 03/29/21 1724 03/30/21 0414 03/31/21 0014  WBC 6.6 7.2 6.3 5.6  NEUTROABS 4.5 4.2  --   --   HGB 9.3* 9.8* 8.5* 9.1*  HCT 32.9* 35.3* 30.7* 31.4*  MCV 68.0* 69.2* 68.8* 68.0*  PLT 242 298 224 225       Signed:  Meredeth Ide MD.  [facility] 03/31/2021, 12:32 PM

## 2021-04-02 ENCOUNTER — Ambulatory Visit: Payer: Managed Care, Other (non HMO) | Admitting: Neurology

## 2021-04-02 ENCOUNTER — Encounter: Payer: Self-pay | Admitting: Neurology

## 2021-04-02 ENCOUNTER — Other Ambulatory Visit: Payer: Self-pay

## 2021-04-02 VITALS — BP 133/79 | HR 90 | Ht 68.0 in | Wt 153.5 lb

## 2021-04-02 DIAGNOSIS — H9191 Unspecified hearing loss, right ear: Secondary | ICD-10-CM | POA: Diagnosis not present

## 2021-04-02 DIAGNOSIS — G08 Intracranial and intraspinal phlebitis and thrombophlebitis: Secondary | ICD-10-CM | POA: Diagnosis not present

## 2021-04-02 DIAGNOSIS — H938X1 Other specified disorders of right ear: Secondary | ICD-10-CM | POA: Diagnosis not present

## 2021-04-02 LAB — CARDIOLIPIN ANTIBODIES, IGG, IGM, IGA
Anticardiolipin IgA: <9 APL U/mL (ref 0–11)
Anticardiolipin IgG: <9 GPL U/mL (ref 0–14)
Anticardiolipin IgM: 10 MPL U/mL (ref 0–12)

## 2021-04-02 LAB — HOMOCYSTEINE: Homocysteine: 19 umol/L — ABNORMAL HIGH (ref 0.0–14.5)

## 2021-04-02 MED ORDER — APIXABAN 5 MG PO TABS: 5.0000 mg | ORAL_TABLET | Freq: Two times a day (BID) | ORAL | 2 refills | Status: DC

## 2021-04-02 NOTE — Patient Instructions (Signed)
I had a long discussion with the patient and her mother regarding her recent admission for cerebral venous sinus thrombosis likely triggered by usage of birth control pills.  She is doing quite well.  I recommend she discontinue her birth control pills and see her gynecologist to discuss alternative treatment options for her menstrual bleeding.  Continue Eliquis 5 mg twice daily for at least 6 to 9 months and maintain aggressive hydration with at least 8 to 10 glasses of liquids per day.  I expect her headaches and right ear pressure and hearing loss to improve gradually.  If she has worsening or recurrent symptoms may need more aggressive treatment approaches like mechanical thrombectomy.  She will return for follow-up in the future in 3 months and will do follow-up MRI and MR venogram at that visit.

## 2021-04-02 NOTE — Progress Notes (Signed)
[facility] [address] [place]. [place] [address] [phone]       OFFICE FOLLOW-UP NOTE  [name] Date of Birth:  2001/04/15 Medical Record Number:  [id number]   HPI: Tammy Weber is a pleasant 20 year old Caucasian lady seen today for initial office follow-up following hospital consultation for cerebral venous sinus thrombosis.  She is accompanied by her mother.  History is obtained from them and review of electronic medical records and I personally reviewed pertinent available imaging films in PACS.  She is a 20 year old female with no significant past medical history who initially presented on 03/29/2021 with worsening headache and muffled hearing.  She described right-sided throbbing headache mostly in the temples and the back of the head for the past 3 weeks associated with some sound sensitivity.  She was seen in the ER on 03/27/2021 and CT scan and CT venogram showed right sigmoid and transverse sinus thrombosis.  Since she had no focal deficits or she was started on low molecular weight subcu heparin 100 mg every 24 hours for 5 days along with Pradaxa 150 mg twice daily and discharged home.  However she returned 2 days later to the ER with worsening headache and pressure in head and muffled hearing like she is underwater on the right.  MRI scan of the brain showed no acute infarct and MR venogram showed occlusive thrombus in the right transverse and sigmoid sinus extending into the jugular bulb and upper portion of jugular vein.  Comparison between the previous CT venogram and the MR venogram was not entirely accurate due to technical differences but the thrombus was felt to have progressed a bit.  Patient was admitted this time and started on IV heparin drip and switched to Eliquis.  Possibility of doing mechanical thrombectomy to decrease clot burden were discussed with the patient and her father with the patient shows not to have mechanical thrombectomy.   Patient was advised to quit her birth control pills and see her gynecologist to discuss alternative treatment options for her heavy menstruation.  She was referred to hematologist Dr. Parke Poisson but that appointment has not yet been made.  She had low vitamin B12 level of 177 and was started on 1000 mcg of vitamin B12 and daily.  She also had iron deficiency anemia for which she was given Ferrlecit 50 mg IV 2 doses and discharged on ferrous gluconate 1 tablet daily.  Patient states she is done well since discharge.  She is tolerating Eliquis well without bruising or bleeding.  She feels there is improvement in the pressure in the right ear as well as headaches which have now practically gone.  She still has subjective decreased hearing and has trouble when there are multiple sounds occurring.  She has gone back to college and has no problems with her classes.  She wants to drive again.  She denies any prior history of deep vein thrombosis, pulmonary embolism, sickle cell disease.  There is no family history of blood clots at a young age.  She had no history of smoking cigarettes but does vape occasionally.  She had a Pfizer vaccine on 10/11/2019 but has not had any recent COVID infection  ROS:   14 system review of systems is positive for pressure in the ear, decreased hearing, headache, heavy menstrual bleeding all other systems negative  PMH:  Past Medical History:  Diagnosis Date   Vaccine for human papilloma virus (HPV) types 6, 11, 16, and 18 administered  Social History:  Social History   Socioeconomic History   Marital status: Single    Spouse name: Not on file   Number of children: Not on file   Years of education: Not on file   Highest education level: Not on file  Occupational History   Not on file  Tobacco Use   Smoking status: Never   Smokeless tobacco: Never  Vaping Use   Vaping Use: Never used  Substance and Sexual Activity   Alcohol use: Yes   Drug use: No   Sexual activity:  Yes    Birth control/protection: Pill  Other Topics Concern   Not on file  Social History Narrative   Lives on campus at [facility]   Right Handed   Drinks caffeine rarely   Social Determinants of Health   Financial Resource Strain: Not on file  Food Insecurity: Not on file  Transportation Needs: Not on file  Physical Activity: Not on file  Stress: Not on file  Social Connections: Not on file  Intimate Partner Violence: Not on file    Medications:   Current Outpatient Medications on File Prior to Visit  Medication Sig Dispense Refill   acetaminophen (TYLENOL) 500 MG tablet Take 500 mg by mouth every 6 (six) hours as needed for moderate pain or headache.     Cholecalciferol (VITAMIN D3) 1.25 MG (50000 UT) TABS Take 10,000 Units by mouth daily.     ferrous gluconate (FERGON) 324 MG tablet Take 1 tablet (324 mg total) by mouth daily with breakfast. 30 tablet 3   folic acid (FOLVITE) 1 MG tablet Take 1 tablet (1 mg total) by mouth daily. 30 tablet 0   Magnesium 100 MG CAPS Take 100 mg by mouth daily.     vitamin B-12 1000 MCG tablet Take 1 tablet (1,000 mcg total) by mouth daily. 30 tablet 3   No current facility-administered medications on file prior to visit.    Allergies:   Allergies  Allergen Reactions   Latex Itching    Physical Exam General: well developed, well nourished young Caucasian lady, seated, in no evident distress Head: head normocephalic and atraumatic.  Neck: supple with no carotid or supraclavicular bruits Cardiovascular: regular rate and rhythm, no murmurs Musculoskeletal: no deformity Skin:  no rash/petichiae Vascular:  Normal pulses all extremities Vitals:   04/02/21 0952  BP: 133/79  Pulse: 90   Neurologic Exam Mental Status: Awake and fully alert. Oriented to place and time. Recent and remote memory intact. Attention span, concentration and fund of knowledge appropriate. Mood and affect appropriate.  Cranial Nerves: Fundoscopic exam reveals sharp  disc margins. Pupils equal, briskly reactive to light. Extraocular movements full without nystagmus. Visual fields full to confrontation. 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 Facial sensation intact. Face, tongue, palate moves normally and symmetrically.  Motor: Normal bulk and tone. Normal strength in all tested extremity muscles. Sensory.: intact to touch ,pinprick .position and vibratory sensation.  Coordination: Rapid alternating movements normal in all extremities. Finger-to-nose and heel-to-shin performed accurately bilaterally. Gait and Station: Arises from chair without difficulty. Stance is normal. Gait demonstrates normal stride length and balance . Able to heel, toe and tandem walk without difficulty.  Reflexes: 1+ and symmetric. Toes downgoing.   NIHSS 0 Modified Rankin 1   ASSESSMENT: 20 year old Caucasian lady with right transverse and sigmoid sinus and jugular vein thrombosis likely related to using birth control pills.  She is doing quite well with only mild headache and right  ear pressure and decreased hearing.     PLAN:I had a long discussion with the patient and her mother regarding her recent admission for cerebral venous sinus thrombosis likely triggered by usage of birth control pills.  She is doing quite well.  I recommend she discontinue her birth control pills and see her gynecologist to discuss alternative treatment options for her menstrual bleeding.  Continue Eliquis 5 mg twice daily for at least 6 to 9 months and maintain aggressive hydration with at least 8 to 10 glasses of liquids per day.  I expect her headaches and right ear pressure and hearing loss to improve gradually.  If she has worsening or recurrent symptoms may need more aggressive treatment approaches like mechanical thrombectomy.  She will return for follow-up in the future in 3 months and will do follow-up MRI and MR venogram at that visit. Greater than 50% of time  during this 25 minute visit was spent on counseling,explanation of diagnosis of cerebral venous sinus thrombosis, planning of further management, discussion with patient and family and coordination of care Delia Heady, MD Note: This document was prepared with digital dictation and possible smart phrase technology. Any transcriptional errors that result from this process are unintentional

## 2021-04-03 ENCOUNTER — Telehealth: Payer: Self-pay | Admitting: Hematology

## 2021-04-03 LAB — BETA-2-GLYCOPROTEIN I ABS, IGG/M/A
Beta-2 Glyco I IgG: <9 GPI IgG units (ref 0–20)
Beta-2-Glycoprotein I IgA: <9 GPI IgA units (ref 0–25)
Beta-2-Glycoprotein I IgM: 9 GPI IgM units (ref 0–32)

## 2021-04-03 LAB — ANTINUCLEAR ANTIBODIES, IFA: ANA Ab, IFA: NEGATIVE

## 2021-04-03 NOTE — Telephone Encounter (Signed)
Scheduled appts per 9/30 staff msg from Dr. Mosetta Putt. Pt's mother is aware.

## 2021-04-04 LAB — PHOSPHATIDYLSERINE ANTIBODIES
Phosphatydalserine, IgA: <1 APS Units (ref 0–19)
Phosphatydalserine, IgG: <9 Units (ref 0–30)
Phosphatydalserine, IgM: 21 Units (ref 0–30)

## 2021-04-04 LAB — FACTOR 5 LEIDEN

## 2021-04-05 LAB — PROTHROMBIN GENE MUTATION

## 2021-04-06 LAB — MTHFR DNA ANALYSIS

## 2021-04-24 ENCOUNTER — Other Ambulatory Visit: Payer: Self-pay | Admitting: Hematology

## 2021-04-24 DIAGNOSIS — E538 Deficiency of other specified B group vitamins: Secondary | ICD-10-CM | POA: Insufficient documentation

## 2021-04-24 DIAGNOSIS — D500 Iron deficiency anemia secondary to blood loss (chronic): Secondary | ICD-10-CM

## 2021-04-26 ENCOUNTER — Other Ambulatory Visit: Payer: Self-pay | Admitting: Obstetrics and Gynecology

## 2021-04-26 DIAGNOSIS — Z3041 Encounter for surveillance of contraceptive pills: Secondary | ICD-10-CM

## 2021-04-30 ENCOUNTER — Telehealth: Payer: Self-pay | Admitting: Hematology

## 2021-04-30 NOTE — Telephone Encounter (Signed)
Left message with rescheduled upcoming appointment due to provider's schedule. 

## 2021-05-01 ENCOUNTER — Inpatient Hospital Stay: Payer: Managed Care, Other (non HMO)

## 2021-05-01 ENCOUNTER — Encounter: Payer: Self-pay | Admitting: Hematology

## 2021-05-01 ENCOUNTER — Inpatient Hospital Stay: Payer: Managed Care, Other (non HMO) | Admitting: Hematology

## 2021-05-01 ENCOUNTER — Inpatient Hospital Stay: Payer: Managed Care, Other (non HMO) | Attending: Hematology

## 2021-05-01 ENCOUNTER — Other Ambulatory Visit: Payer: Self-pay

## 2021-05-01 VITALS — BP 132/79 | HR 78 | Temp 98.5°F | Resp 17 | Wt 160.9 lb

## 2021-05-01 DIAGNOSIS — D509 Iron deficiency anemia, unspecified: Secondary | ICD-10-CM | POA: Insufficient documentation

## 2021-05-01 DIAGNOSIS — D500 Iron deficiency anemia secondary to blood loss (chronic): Secondary | ICD-10-CM

## 2021-05-01 DIAGNOSIS — E538 Deficiency of other specified B group vitamins: Secondary | ICD-10-CM | POA: Insufficient documentation

## 2021-05-01 DIAGNOSIS — G08 Intracranial and intraspinal phlebitis and thrombophlebitis: Secondary | ICD-10-CM | POA: Diagnosis not present

## 2021-05-01 DIAGNOSIS — Z7901 Long term (current) use of anticoagulants: Secondary | ICD-10-CM | POA: Diagnosis not present

## 2021-05-01 LAB — CBC WITH DIFFERENTIAL/PLATELET
Abs Immature Granulocytes: 0.01 10*3/uL (ref 0.00–0.07)
Basophils Absolute: 0 10*3/uL (ref 0.0–0.1)
Basophils Relative: 1 %
Eosinophils Absolute: 0.1 10*3/uL (ref 0.0–0.5)
Eosinophils Relative: 2 %
HCT: 37.8 % (ref 36.0–46.0)
Hemoglobin: 11.6 g/dL — ABNORMAL LOW (ref 12.0–15.0)
Immature Granulocytes: 0 %
Lymphocytes Relative: 35 %
Lymphs Abs: 2.2 10*3/uL (ref 0.7–4.0)
MCH: 23.2 pg — ABNORMAL LOW (ref 26.0–34.0)
MCHC: 30.7 g/dL (ref 30.0–36.0)
MCV: 75.6 fL — ABNORMAL LOW (ref 80.0–100.0)
Monocytes Absolute: 0.5 10*3/uL (ref 0.1–1.0)
Monocytes Relative: 8 %
Neutro Abs: 3.3 10*3/uL (ref 1.7–7.7)
Neutrophils Relative %: 54 %
Platelets: 214 10*3/uL (ref 150–400)
RBC: 5 MIL/uL (ref 3.87–5.11)
RDW: 24.7 % — ABNORMAL HIGH (ref 11.5–15.5)
Smear Review: NORMAL
WBC: 6.2 10*3/uL (ref 4.0–10.5)
nRBC: 0 % (ref 0.0–0.2)

## 2021-05-01 LAB — IRON AND TIBC
Iron: 28 ug/dL — ABNORMAL LOW (ref 41–142)
Saturation Ratios: 7 % — ABNORMAL LOW (ref 21–57)
TIBC: 405 ug/dL (ref 236–444)
UIBC: 377 ug/dL (ref 120–384)

## 2021-05-01 LAB — VITAMIN B12: Vitamin B-12: 471 pg/mL (ref 180–914)

## 2021-05-01 LAB — FERRITIN: Ferritin: 23 ng/mL (ref 11–307)

## 2021-05-01 MED ORDER — CYANOCOBALAMIN 1000 MCG/ML IJ SOLN
1000.0000 ug | Freq: Once | INTRAMUSCULAR | Status: AC
  Administered 2021-05-01: 1000 ug via INTRAMUSCULAR
  Filled 2021-05-01: qty 1

## 2021-05-01 NOTE — Patient Instructions (Signed)
Vitamin B12 Deficiency Vitamin B12 deficiency means that your body does not have enough vitamin B12. The body needs this vitamin: To make red blood cells. To make genes (DNA). To help the nerves work. If you do not have enough vitamin B12 in your body, you can have health problems. What are the causes? Not eating enough foods that contain vitamin B12. Not being able to absorb vitamin B12 from the food that you eat. Certain digestive system diseases. A condition in which the body does not make enough of a certain protein, which results in too few red blood cells (pernicious anemia). Having a surgery in which part of the stomach or small intestine is removed. Taking medicines that make it hard for the body to absorb vitamin B12. These medicines include: Heartburn medicines. Some antibiotic medicines. Other medicines that are used to treat certain conditions. What increases the risk? Being older than age 50. Eating a vegetarian or vegan diet, especially while you are pregnant. Eating a poor diet while you are pregnant. Taking certain medicines. Having alcoholism. What are the signs or symptoms? In some cases, there are no symptoms. If the condition leads to too few blood cells or nerve damage, symptoms can occur, such as: Feeling weak. Feeling tired (fatigued). Not being hungry. Weight loss. A loss of feeling (numbness) or tingling in your hands and feet. Redness and burning of the tongue. Being mixed up (confused) or having memory problems. Sadness (depression). Problems with your senses. This can include color blindness, ringing in the ears, or loss of taste. Watery poop (diarrhea) or trouble pooping (constipation). Trouble walking. If anemia is very bad, symptoms can include: Being short of breath. Being dizzy. Having a very fast heartbeat. How is this treated? Changing the way you eat and drink, such as: Eating more foods that contain vitamin B12. Drinking little or no  alcohol. Getting vitamin B12 shots. Taking vitamin B12 supplements. Your doctor will tell you the dose that is best for you. Follow these instructions at home: Eating and drinking  Eat lots of healthy foods that contain vitamin B12. These include: Meats and poultry, such as beef, pork, chicken, turkey, and organ meats, such as liver. Seafood, such as clams, rainbow trout, salmon, tuna, and haddock. Eggs. Cereal and dairy products that have vitamin B12 added to them. Check the label. The items listed above may not be a complete list of what you can eat and drink. Contact a dietitian for more options. General instructions Get any shots as told by your doctor. Take supplements only as told by your doctor. Do not drink alcohol if your doctor tells you not to. In some cases, you may only be asked to limit alcohol use. Keep all follow-up visits as told by your doctor. This is important. Contact a doctor if: Your symptoms come back. Get help right away if: You have trouble breathing. You have a very fast heartbeat. You have chest pain. You get dizzy. You pass out. Summary Vitamin B12 deficiency means that your body is not getting enough vitamin B12. In some cases, there are no symptoms of this condition. Treatment may include making a change in the way you eat and drink, getting vitamin B12 shots, or taking supplements. Eat lots of healthy foods that contain vitamin B12. This information is not intended to replace advice given to you by your health care provider. Make sure you discuss any questions you have with your health care provider. Document Revised: 02/24/2018 Document Reviewed: 02/24/2018 Elsevier Patient Education    2022 Elsevier Inc.  

## 2021-05-01 NOTE — Progress Notes (Addendum)
[facility]   [phone] [phone]   Clinic Follow up Note   Patient Care Team: Pcp, No as PCP - General  Date of Service:  05/01/2021  CHIEF COMPLAINT: f/u of cerebral venous sinus thrombosis  CURRENT THERAPY:  Eliquis, started 04/02/21  ASSESSMENT & PLAN:  Tammy Weber is a 20 y.o. female with   1. 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 CT head venogram confirmed right transverse and sigmoid sinus thrombosis. She was discharged with 5 days of Lovenox then oral Pradaxa. Birth control pills were discontinued at that time. -returned 03/29/21 with worsening headache with muffled hearing. Brain MRI showed mild progression of CVST. -she received heparin infusion while in the hospital. She was discharged on Eliquis with loading dose. She is tolerating well  -she is scheduled for f/u with neurology in 07/2021. -Plan to continue Eliquis on Tuesday end of May 2023, will do hypercoagulopathy work-up 1 month after off anticoagulation (she had negative factor V Leiden mutation, prothrombin gene mutation, beta-2 glycoprotein and cardiolipin antibodies, will not repeat next time)  -We again reviewed other risks for thrombosis, such as chemotherapy, smoking, overweight, and she knows to avoid this contributed.  2. Anemia, iron deficiency and B12 deficiency -she received two doses of ferric gluconate and one B12 while in the hospital -improved since discharge, 11.6 today (05/01/21) -she notes she was on her period while in the hospital. She has not had another one since. She is scheduled to see her GYN next month.  She knows to avoid birth control.  Due to her risk of thrombosis.   PLAN: -We will proceed with injection today, and as needed in the future.  Continue oral B12  -She will try liquid oral iron  -lab in 2 and 4 months to see if she needs IV iron or B12 injections, will give IV ferric gluconate  if ferritin<30 or low iron, and B12 injection if level <300 -f/u in 4 months   No problem-specific Assessment & Plan notes found for this encounter.   INTERVAL HISTORY:  Tammy Weber is here for a follow up of CVST. She was last seen by me on 03/30/21 while she was in the hospital. She presents to the clinic accompanied by her mother. She reports her headache is mostly gone now. She notes that the "vitamin" makes her nauseated. She further explains that it's when she takes the iron and the B12 together. She notes she was on her period while in the hospital and reports she has not had one since ("I'm 5 days late").   All other systems were reviewed with the patient and are negative.  MEDICAL HISTORY:  Past Medical History:  Diagnosis Date   Vaccine for human papilloma virus (HPV) types 6, 11, 16, and 18 administered     SURGICAL HISTORY: Past Surgical History:  Procedure Laterality Date   NO PAST SURGERIES      I have reviewed the social history and family history with the patient and they are unchanged from previous note.  ALLERGIES:  is allergic to latex.  MEDICATIONS:  Current Outpatient Medications  Medication Sig Dispense Refill   acetaminophen (TYLENOL) 500 MG tablet Take 500 mg by mouth every 6 (six) hours as needed for moderate pain or headache.     apixaban (ELIQUIS) 5 MG TABS tablet Take 1 tablet (5 mg total) by mouth 2 (two) times daily. take 5 mg po twice daily. 180  tablet 2   Cholecalciferol (VITAMIN D3) 1.25 MG (50000 UT) TABS Take 10,000 Units by mouth daily.     ferrous gluconate (FERGON) 324 MG tablet Take 1 tablet (324 mg total) by mouth daily with breakfast. 30 tablet 3   Magnesium 100 MG CAPS Take 100 mg by mouth daily.     vitamin B-12 1000 MCG tablet Take 1 tablet (1,000 mcg total) by mouth daily. 30 tablet 3   No current facility-administered medications for this visit.    PHYSICAL EXAMINATION: ECOG PERFORMANCE STATUS: 0 - Asymptomatic  Vitals:    05/01/21 0859  BP: 132/79  Pulse: 78  Resp: 17  Temp: 98.5 F (36.9 C)  SpO2: 100%   Wt Readings from Last 3 Encounters:  05/01/21 160 lb 14.4 oz (73 kg) (88 %, Z= 1.16)*  04/02/21 153 lb 8 oz (69.6 kg) (83 %, Z= 0.96)*  03/31/21 161 lb 2.5 oz (73.1 kg) (88 %, Z= 1.17)*   * Growth percentiles are based on CDC (Girls, 2-20 Years) data.     GENERAL:alert, no distress and comfortable SKIN: skin color normal, no rashes or significant lesions EYES: normal, Conjunctiva are pink and non-injected, sclera clear  NEURO: alert & oriented x 3 with fluent speech  LABORATORY DATA:  I have reviewed the data as listed CBC Latest Ref Rng & Units 05/01/2021 03/31/2021 03/30/2021  WBC 4.0 - 10.5 K/uL 6.2 5.6 6.3  Hemoglobin 12.0 - 15.0 g/dL 11.6(L) 9.1(L) 8.5(L)  Hematocrit 36.0 - 46.0 % 37.8 31.4(L) 30.7(L)  Platelets 150 - 400 K/uL 214 225 224     CMP Latest Ref Rng & Units 03/30/2021 03/29/2021 03/27/2021  Glucose 70 - 99 mg/dL 94 99 95  BUN 6 - 20 mg/dL 5(L) 7 6  Creatinine 0.44 - 1.00 mg/dL 0.77 0.91 0.82  Sodium 135 - 145 mmol/L 137 138 134(L)  Potassium 3.5 - 5.1 mmol/L 4.1 4.1 3.9  Chloride 98 - 111 mmol/L 108 104 103  CO2 22 - 32 mmol/L 21(L) 24 22  Calcium 8.9 - 10.3 mg/dL 8.9 9.5 9.1  Total Protein 6.5 - 8.1 g/dL - 7.5 6.8  Total Bilirubin 0.3 - 1.2 mg/dL - 0.2(L) 0.8  Alkaline Phos 38 - 126 U/L - 58 63  AST 15 - 41 U/L - 16 15  ALT 0 - 44 U/L - 12 11      RADIOGRAPHIC STUDIES: I have personally reviewed the radiological images as listed and agreed with the findings in the report. No results found.    No orders of the defined types were placed in this encounter.  All questions were answered. The patient knows to call the clinic with any problems, questions or concerns. No barriers to learning was detected. The total time spent in the appointment was 30 minutes.     Truitt Merle, MD 05/01/2021   I, Wilburn Mylar, am acting as scribe for Truitt Merle, MD.   I have  reviewed the above documentation for accuracy and completeness, and I agree with the above.

## 2021-05-14 ENCOUNTER — Telehealth: Payer: Self-pay | Admitting: *Deleted

## 2021-05-14 NOTE — Telephone Encounter (Signed)
Mychart message from patient:  Hi, recently my headaches have come back and Tylenol usually does not help. I was wondering what your thoughts are and what you think I should do. My headaches this time are mainly the front and sides and sometimes it's centered on my temples.   ___________________________________ I called the patient. Since last Friday, 05/11/21, she has kept a constant headache. The severity is variable. The pain is sometimes throbbing and other times sharp/stabbing. Denies vision changes or dizziness. She has only tried Tylenol twice. It provided little relief - maybe an hour before it returned. Rest tends to help. She would like to know alternate options to treat the headaches.   She has nausea but says it is from her iron supplement. Instructed her to take it with food. She is also going to try a slow release to see if the GI upset improves.

## 2021-05-14 NOTE — Telephone Encounter (Signed)
I called the patient. Note in Epic.

## 2021-05-15 ENCOUNTER — Other Ambulatory Visit: Payer: Self-pay | Admitting: Neurology

## 2021-05-15 MED ORDER — TOPIRAMATE 25 MG PO TABS: 25.0000 mg | ORAL_TABLET | Freq: Two times a day (BID) | ORAL | 2 refills | Status: DC

## 2021-05-16 NOTE — Telephone Encounter (Signed)
I spoke to the patient. She is agreeable to try the offered topiramate. She understands the intent of the medication is to lower her headache frequency. She knows it may not work right away. If no improvement in a few weeks, I ask that she call or send a mychart message. Dr. Pearlean Brownie may consider increasing the dose.

## 2021-06-06 ENCOUNTER — Encounter: Payer: Self-pay | Admitting: Neurology

## 2021-06-06 ENCOUNTER — Telehealth: Payer: Self-pay | Admitting: *Deleted

## 2021-06-06 NOTE — Telephone Encounter (Signed)
Mychart message from patient:  Hi, I was wondering if there was any way I can have the MRI done before my appointment so my mom and I will be able to view the scans at my appointment. My mom wants to see all the scans at the appointment on January 24. ______________________________________ Pt seen following admission for cerebral venous sinus thrombosis. ______________________________________ Last note on 04/02/21: She will return for follow-up in the future in 3 months and will do follow-up MRI and MR venogram at that visit.

## 2021-06-08 ENCOUNTER — Other Ambulatory Visit: Payer: Self-pay | Admitting: Neurology

## 2021-06-08 DIAGNOSIS — G08 Intracranial and intraspinal phlebitis and thrombophlebitis: Secondary | ICD-10-CM

## 2021-06-11 ENCOUNTER — Telehealth: Payer: Self-pay | Admitting: Neurology

## 2021-06-11 ENCOUNTER — Encounter: Payer: Self-pay | Admitting: *Deleted

## 2021-06-11 NOTE — Telephone Encounter (Signed)
[employer] order sent to GI, they will obtain the auth and reach out to the patient to schedule.  ?

## 2021-06-12 ENCOUNTER — Other Ambulatory Visit: Payer: Self-pay | Admitting: Neurology

## 2021-06-12 ENCOUNTER — Telehealth: Payer: Self-pay | Admitting: Neurology

## 2021-06-12 DIAGNOSIS — G08 Intracranial and intraspinal phlebitis and thrombophlebitis: Secondary | ICD-10-CM

## 2021-06-12 NOTE — Telephone Encounter (Signed)
Patient is scheduled at GI for 07/04/21. GI will obtain the auth.

## 2021-06-12 NOTE — Telephone Encounter (Signed)
Patient is scheduled at GI for 07/04/21.

## 2021-06-18 ENCOUNTER — Ambulatory Visit: Payer: Managed Care, Other (non HMO) | Admitting: Obstetrics and Gynecology

## 2021-06-29 ENCOUNTER — Ambulatory Visit: Payer: Managed Care, Other (non HMO) | Admitting: Obstetrics and Gynecology

## 2021-07-04 ENCOUNTER — Other Ambulatory Visit: Payer: Self-pay

## 2021-07-04 ENCOUNTER — Ambulatory Visit
Admission: RE | Admit: 2021-07-04 | Discharge: 2021-07-04 | Disposition: A | Discharge status: Home / Self Care | Payer: Managed Care, Other (non HMO) | Source: Ambulatory Visit | Attending: Neurology | Admitting: Neurology

## 2021-07-04 DIAGNOSIS — G08 Intracranial and intraspinal phlebitis and thrombophlebitis: Secondary | ICD-10-CM

## 2021-07-04 MED ORDER — GADOBENATE DIMEGLUMINE 529 MG/ML IV SOLN
15.0000 mL | Freq: Once | INTRAVENOUS | Status: AC | PRN
  Administered 2021-07-04: 15 mL via INTRAVENOUS

## 2021-07-10 ENCOUNTER — Other Ambulatory Visit: Payer: Self-pay

## 2021-07-10 ENCOUNTER — Inpatient Hospital Stay: Payer: Managed Care, Other (non HMO) | Attending: Hematology

## 2021-07-10 DIAGNOSIS — E538 Deficiency of other specified B group vitamins: Secondary | ICD-10-CM | POA: Diagnosis not present

## 2021-07-10 DIAGNOSIS — D509 Iron deficiency anemia, unspecified: Secondary | ICD-10-CM | POA: Insufficient documentation

## 2021-07-10 DIAGNOSIS — D500 Iron deficiency anemia secondary to blood loss (chronic): Secondary | ICD-10-CM

## 2021-07-10 LAB — CBC WITH DIFFERENTIAL/PLATELET
Abs Immature Granulocytes: 0.02 10*3/uL (ref 0.00–0.07)
Basophils Absolute: 0 10*3/uL (ref 0.0–0.1)
Basophils Relative: 1 %
Eosinophils Absolute: 0.1 10*3/uL (ref 0.0–0.5)
Eosinophils Relative: 1 %
HCT: 39.3 % (ref 36.0–46.0)
Hemoglobin: 12.3 g/dL (ref 12.0–15.0)
Immature Granulocytes: 0 %
Lymphocytes Relative: 35 %
Lymphs Abs: 1.8 10*3/uL (ref 0.7–4.0)
MCH: 24.6 pg — ABNORMAL LOW (ref 26.0–34.0)
MCHC: 31.3 g/dL (ref 30.0–36.0)
MCV: 78.4 fL — ABNORMAL LOW (ref 80.0–100.0)
Monocytes Absolute: 0.4 10*3/uL (ref 0.1–1.0)
Monocytes Relative: 9 %
Neutro Abs: 2.8 10*3/uL (ref 1.7–7.7)
Neutrophils Relative %: 54 %
Platelets: 219 10*3/uL (ref 150–400)
RBC: 5.01 MIL/uL (ref 3.87–5.11)
RDW: 14 % (ref 11.5–15.5)
WBC: 5.1 10*3/uL (ref 4.0–10.5)
nRBC: 0 % (ref 0.0–0.2)

## 2021-07-10 LAB — FERRITIN: Ferritin: <4 ng/mL — ABNORMAL LOW (ref 11–307)

## 2021-07-10 NOTE — Progress Notes (Signed)
°  Your repeat MRI scan of the brain and MR venogram of the brain shows some improvement in the blood flow in the blocked sinuses on the back of the head on the right.  Nothing to worry about

## 2021-07-11 ENCOUNTER — Encounter: Payer: Self-pay | Admitting: Hematology

## 2021-07-12 ENCOUNTER — Telehealth: Payer: Self-pay | Admitting: Hematology

## 2021-07-12 ENCOUNTER — Other Ambulatory Visit: Payer: Self-pay | Admitting: Hematology

## 2021-07-12 NOTE — Telephone Encounter (Signed)
Patient called and stated she was told by Dr. Mosetta Putt that she was going to be scheduled to receive an iron infusion. Patient stated she stopped taking the oral iron liquid due to the bad taste. Sent appointment request to MD.

## 2021-07-15 ENCOUNTER — Other Ambulatory Visit: Payer: Self-pay | Admitting: Hematology

## 2021-07-17 ENCOUNTER — Other Ambulatory Visit: Payer: Self-pay

## 2021-07-17 ENCOUNTER — Telehealth: Payer: Self-pay | Admitting: Nurse Practitioner

## 2021-07-17 NOTE — Telephone Encounter (Signed)
Sch per 11/7 inbasket, left msg 

## 2021-07-19 ENCOUNTER — Ambulatory Visit: Payer: Managed Care, Other (non HMO) | Admitting: Obstetrics and Gynecology

## 2021-07-23 ENCOUNTER — Telehealth: Payer: Self-pay | Admitting: Neurology

## 2021-07-23 NOTE — Telephone Encounter (Signed)
Lvm and sent mychart message informing pt of r/s needed for 1/24 appt- Dr. Pearlean Brownie out of office.

## 2021-07-24 ENCOUNTER — Ambulatory Visit: Payer: Managed Care, Other (non HMO) | Admitting: Neurology

## 2021-07-25 ENCOUNTER — Encounter: Payer: Self-pay | Admitting: Neurology

## 2021-07-25 ENCOUNTER — Ambulatory Visit (INDEPENDENT_AMBULATORY_CARE_PROVIDER_SITE_OTHER): Payer: Managed Care, Other (non HMO) | Admitting: Neurology

## 2021-07-25 ENCOUNTER — Other Ambulatory Visit: Payer: Self-pay

## 2021-07-25 ENCOUNTER — Ambulatory Visit (INDEPENDENT_AMBULATORY_CARE_PROVIDER_SITE_OTHER): Payer: Managed Care, Other (non HMO) | Admitting: Obstetrics and Gynecology

## 2021-07-25 ENCOUNTER — Encounter: Payer: Self-pay | Admitting: Obstetrics and Gynecology

## 2021-07-25 VITALS — BP 129/85 | HR 109 | Ht 68.0 in | Wt 162.0 lb

## 2021-07-25 VITALS — BP 130/70 | Ht 68.0 in | Wt 163.0 lb

## 2021-07-25 DIAGNOSIS — G08 Intracranial and intraspinal phlebitis and thrombophlebitis: Secondary | ICD-10-CM

## 2021-07-25 DIAGNOSIS — Z01419 Encounter for gynecological examination (general) (routine) without abnormal findings: Secondary | ICD-10-CM

## 2021-07-25 DIAGNOSIS — Z30011 Encounter for initial prescription of contraceptive pills: Secondary | ICD-10-CM

## 2021-07-25 DIAGNOSIS — I6930 Unspecified sequelae of cerebral infarction: Secondary | ICD-10-CM

## 2021-07-25 DIAGNOSIS — Z113 Encounter for screening for infections with a predominantly sexual mode of transmission: Secondary | ICD-10-CM | POA: Diagnosis not present

## 2021-07-25 MED ORDER — NORETHINDRONE 0.35 MG PO TABS: 1.0000 | ORAL_TABLET | Freq: Every day | ORAL | 3 refills | Status: DC

## 2021-07-25 NOTE — Progress Notes (Signed)
[facility] [address] [place]. [place] [address] [phone]       OFFICE FOLLOW-UP NOTE  [name] Date of Birth:  [birth year] Medical Record Number:  [id number]   HPI: Initial visit 04/02/2021 Tammy Weber is a pleasant 21 year old Caucasian lady seen today for initial office follow-up following hospital consultation for cerebral venous sinus thrombosis.  She is accompanied by her mother.  History is obtained from them and review of electronic medical records and I personally reviewed pertinent available imaging films in PACS.  She is a 21 year old female with no significant past medical history who initially presented on 03/29/2021 with worsening headache and muffled hearing.  She described right-sided throbbing headache mostly in the temples and the back of the head for the past 3 weeks associated with some sound sensitivity.  She was seen in the ER on 03/27/2021 and CT scan and CT venogram showed right sigmoid and transverse sinus thrombosis.  Since she had no focal deficits or she was started on low molecular weight subcu heparin 100 mg every 24 hours for 5 days along with Pradaxa 150 mg twice daily and discharged home.  However she returned 2 days later to the ER with worsening headache and pressure in head and muffled hearing like she is underwater on the right.  MRI scan of the brain showed no acute infarct and MR venogram showed occlusive thrombus in the right transverse and sigmoid sinus extending into the jugular bulb and upper portion of jugular vein.  Comparison between the previous CT venogram and the MR venogram was not entirely accurate due to technical differences but the thrombus was felt to have progressed a bit.  Patient was admitted this time and started on IV heparin drip and switched to Eliquis.  Possibility of doing mechanical thrombectomy to decrease clot burden were discussed with the patient and her father with the patient shows not to have  mechanical thrombectomy.  Patient was advised to quit her birth control pills and see her gynecologist to discuss alternative treatment options for her heavy menstruation.  She was referred to hematologist Dr. Annamaria Boots but that appointment has not yet been made.  She had low vitamin B12 level of 177 and was started on 1000 mcg of vitamin B12 and daily.  She also had iron deficiency anemia for which she was given Ferrlecit 50 mg IV 2 doses and discharged on ferrous gluconate 1 tablet daily.  Patient states she is done well since discharge.  She is tolerating Eliquis well without bruising or bleeding.  She feels there is improvement in the pressure in the right ear as well as headaches which have now practically gone.  She still has subjective decreased hearing and has trouble when there are multiple sounds occurring.  She has gone back to college and has no problems with her classes.  She wants to drive again.  She denies any prior history of deep vein thrombosis, pulmonary embolism, sickle cell disease.  There is no family history of blood clots at a young age.  She had no history of smoking cigarettes but does vape occasionally.  She had a Pfizer vaccine on 10/11/2019 but has not had any recent COVID infection Update 07/25/2021 : She returns for follow-up after last visit 3 months ago.  She is accompanied by her mother.  She is doing well.  She denies significant head pressure or decreased hearing today.  She is tolerating Eliquis well with only minor bruising and bleeding if she is  scratches herself.  She is scheduled to undergo iron infusion next week.  She has not yet met with the gynecologist to discuss alternative medication options for her heavy menstrual bleeding.  She denies any other new health problems.  She did undergo a follow-up MR venogram on 07/04/2021 which shows improved but yet diminished flow in the right transverse, sigmoid sinus and jugular vein.  MRI scan of the brain shows no significant brain  abnormalities except decreased flow signal in the right transverse and sigmoid sinuses.  She has no complaints today. ROS:   14 system review of systems is positive for pressure in the ear, decreased hearing, headache, heavy menstrual bleeding all other systems negative  PMH:  Past Medical History:  Diagnosis Date   Vaccine for human papilloma virus (HPV) types 6, 11, 16, and 18 administered     Social History:  Social History   Socioeconomic History   Marital status: Single    Spouse name: Not on file   Number of children: Not on file   Years of education: Not on file   Highest education level: Not on file  Occupational History   Not on file  Tobacco Use   Smoking status: Never   Smokeless tobacco: Never  Vaping Use   Vaping Use: Never used  Substance and Sexual Activity   Alcohol use: Yes   Drug use: No   Sexual activity: Yes    Birth control/protection: Pill  Other Topics Concern   Not on file  Social History Narrative   Lives on campus at [employer] [employer]   Right Handed   Drinks caffeine rarely   Social Determinants of Health   Financial Resource Strain: Not on file  Food Insecurity: Not on file  Transportation Needs: Not on file  Physical Activity: Not on file  Stress: Not on file  Social Connections: Not on file  Intimate Partner Violence: Not on file    Medications:   Current Outpatient Medications on File Prior to Visit  Medication Sig Dispense Refill   topiramate (TOPAMAX) 25 MG tablet Take 1 tablet (25 mg total) by mouth 2 (two) times daily. 60 tablet 2   apixaban (ELIQUIS) 5 MG TABS tablet Take 1 tablet (5 mg total) by mouth 2 (two) times daily. take 5 mg po twice daily. 180 tablet 2   No current facility-administered medications on file prior to visit.    Allergies:   Allergies  Allergen Reactions   Latex Itching    Physical Exam General: well developed, well nourished young Caucasian lady, seated, in no evident distress Head: head normocephalic and  atraumatic.  Neck: supple with no carotid or supraclavicular bruits Cardiovascular: regular rate and rhythm, no murmurs Musculoskeletal: no deformity Skin:  no rash/petichiae Vascular:  Normal pulses all extremities Vitals:   07/25/21 1215  BP: 129/85  Pulse: (!) 109   Neurologic Exam Mental Status: Awake and fully alert. Oriented to place and time. Recent and remote memory intact. Attention span, concentration and fund of knowledge appropriate. Mood and affect appropriate.  Cranial Nerves: Fundoscopic exam reveals sharp disc margins. Pupils equal, briskly reactive to light. Extraocular movements full without nystagmus. Visual fields full to confrontation. 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 Facial sensation intact. Face, tongue, palate moves normally and symmetrically.  Motor: Normal bulk and tone. Normal strength in all tested extremity muscles. Sensory.: intact to touch ,pinprick .position and vibratory sensation.  Coordination: Rapid alternating movements normal in all extremities. Finger-to-nose and heel-to-shin  performed accurately bilaterally. Gait and Station: Arises from chair without difficulty. Stance is normal. Gait demonstrates normal stride length and balance . Able to heel, toe and tandem walk without difficulty.  Reflexes: 1+ and symmetric. Toes downgoing.       ASSESSMENT: 21 year old Caucasian lady with right transverse and sigmoid sinus and jugular vein thrombosis likely related to using birth control pills.  She is doing quite well  .     PLAN:I had a long discussion with the patient and her mother regarding her cerebral venous sinus thrombosis and discussed results of follow-up MRI scan and MRV and answered questions.  Continue Eliquis for a total of 9 months since her cerebral venous sinus thrombosis in September 2022 and then will switch to aspirin.  She was advised to keep herself adequately hydrated.  We will schedule follow-up  MRV prior to next visit in 6 months. Greater than 50% of time during this 35 minute visit was spent on counseling,explanation of diagnosis of cerebral venous sinus thrombosis, planning of further management, discussion with patient and family and coordination of care Antony Contras, MD Note: This document was prepared with digital dictation and possible smart phrase technology. Any transcriptional errors that result from this process are unintentional

## 2021-07-25 NOTE — Progress Notes (Signed)
PCP:  Pcp, No   Chief Complaint  Patient presents with   Gynecologic Exam    Blood clot in brain possibly due to [facility]     HPI:      [name] is a 21 y.o. G0P0000 who LMP was Patient's last menstrual period was 07/16/2021 (exact date)., presents today for her annual examination.  Her menses are regular every 28-30 days, lasting 7 days, mod to heavy flow for 5 days, changing products Q2 hrs on eliquis (s/p IDA and getting Fe transfusions currently).  Dysmenorrhea mild, occurring first 1-2 days of flow, improved with tylenol, no BTB.    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 Pt currently on Eliquis for 9 months. Seeing neuro and hematology for IDA tx. Neuro is ok for pt to have prog only products for [facility] and menorrhagia (I secure chat messaged him today to confirm).   Sex activity: single partner, contraception--condoms every time.  Last Pap: N/A due to age Hx of STDs: none  There is no FH of breast cancer. There is a FH of ovarian cancer in her mat grt aunt; genetic testing not indicated for pt. The patient does self-breast exams.  Tobacco use: The patient denies current or previous tobacco use. Alcohol use: social with friends No drug use.  Exercise: mod active  She does get adequate calcium but not Vitamin D in her diet. Donald Prose completed   Past Medical History:  Diagnosis Date   Chronic cerebral venous infarction ([place])    Vaccine for human papilloma virus (HPV) types 6, 11, 16, and 18 administered     Past Surgical History:  Procedure Laterality Date   NO PAST SURGERIES      Family History  Problem Relation Age of Onset   Colon cancer Paternal Aunt    Ovarian cancer Other     Social History   Socioeconomic History   Marital status: Single    Spouse name: Not on file   Number of children: Not on file   Years of education: Not on file   Highest education level: Not on file   Occupational History   Not on file  Tobacco Use   Smoking status: Never   Smokeless tobacco: Never  Vaping Use   Vaping Use: Never used  Substance and Sexual Activity   Alcohol use: Yes   Drug use: No   Sexual activity: Yes    Birth control/protection: None, Condom  Other Topics Concern   Not on file  Social History Narrative   L[facility] on campus at [facility]   Right Handed   Drinks caffeine rarely   Social Determinants of Health   Financial Resource Strain: Not on file  Food Insecurity: Not on file  Transportation Needs: Not on file  Physical Activity: Not on file  Stress: Not on file  Social Connections: Not on file  Intimate Partner Violence: Not on file    Outpatient Medications Prior to Visit  Medication Sig Dispense Refill   topiramate (TOPAMAX) 25 MG tablet Take 1 tablet (25 mg total) by mouth 2 (two) times daily. 60 tablet 2   apixaban (ELIQUIS) 5 MG TABS tablet Take 1 tablet (5 mg total) by mouth 2 (two) times daily. take 5 mg po twice daily. 180 tablet 2   No facility-administered medications prior to visit.    ROS:  Review of Systems  Constitutional:  Negative for fatigue, fever and unexpected weight  change.  Respiratory:  Negative for cough, shortness of breath and wheezing.   Cardiovascular:  Negative for chest pain, palpitations and leg swelling.  Gastrointestinal:  Negative for blood in stool, constipation, diarrhea, nausea and vomiting.  Endocrine: Negative for cold intolerance, heat intolerance and polyuria.  Genitourinary:  Negative for dyspareunia, dysuria, flank pain, frequency, genital sores, hematuria, menstrual problem, pelvic pain, urgency, vaginal bleeding, vaginal discharge and vaginal pain.  Musculoskeletal:  Negative for back pain, joint swelling and myalgias.  Skin:  Negative for rash.  Neurological:  Negative for dizziness, syncope, light-headedness, numbness and headaches.  Hematological:  Negative for adenopathy.  Psychiatric/Behavioral:   Negative for agitation, confusion, sleep disturbance and suicidal ideas. The patient is not nervous/anxious.  BREAST: No symptoms   Objective: BP 130/70    Ht 5\' 8"  (1.727 m)    Wt 163 lb (73.9 kg)    LMP 07/16/2021 (Exact Date)    BMI 24.78 kg/m    Physical Exam Constitutional:      Appearance: She is well-developed.  Genitourinary:     Vulva normal.     Right Labia: No rash, tenderness or lesions.    Left Labia: No tenderness, lesions or rash.    No vaginal discharge, erythema or tenderness.      Right Adnexa: not tender and no mass present.    Left Adnexa: not tender and no mass present.    No cervical motion tenderness, friability or polyp.     Uterus is not enlarged or tender.  Breasts:    Right: No mass, nipple discharge, skin change or tenderness.     Left: No mass, nipple discharge, skin change or tenderness.  Neck:     Thyroid: No thyromegaly.  Cardiovascular:     Rate and Rhythm: Normal rate and regular rhythm.     Heart sounds: Normal heart sounds. No murmur heard. Pulmonary:     Effort: Pulmonary effort is normal.     Breath sounds: Normal breath sounds.  Abdominal:     Palpations: Abdomen is soft.     Tenderness: There is no abdominal tenderness. There is no guarding or rebound.  Musculoskeletal:        General: Normal range of motion.     Cervical back: Normal range of motion.  Lymphadenopathy:     Cervical: No cervical adenopathy.  Neurological:     General: No focal deficit present.     Mental Status: She is alert and oriented to person, place, and time.     Cranial Nerves: No cranial nerve deficit.  Skin:    General: Skin is warm and dry.  Psychiatric:        Mood and Affect: Mood normal.        Behavior: Behavior normal.        Thought Content: Thought content normal.        Judgment: Judgment normal.  Vitals reviewed.   Assessment/Plan: Encounter for annual routine gynecological examination  Screening for STD (sexually transmitted disease) -  Plan: Chlamydia/Gonococcus/Trichomonas, NAA  Encounter for initial prescription of contraceptive pills - Plan: norethindrone (MICRONOR) 0.35 MG tablet--prog only options discussed (ok per neuro). Pt declines nexplanon/IUD. Considering POPs vs depo. POPs probably better per CDC guidelines. Pt to start today, condoms. Aware of 3 hr time window.  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 On eliquis for 9 months. Getting Fe transfusions.    Meds ordered this encounter  Medications   norethindrone (MICRONOR) 0.35 MG tablet  Sig: Take 1 tablet (0.35 mg total) by mouth daily.    Dispense:  84 tablet    Refill:  3    Order Specific Question:   Supervising Provider    Answer:   Gae Dry [id number]             GYN counsel adequate intake of calcium and vitamin D, diet and exercise     F/U  Return in about 1 year (around 07/25/2022).  Ryonna Cimini B. Navia Lindahl, PA-C 07/25/2021 4:42 PM

## 2021-07-25 NOTE — Patient Instructions (Signed)
I value your feedback and you entrusting us with your care. If you get a [facility] patient survey, I would appreciate you taking the time to let us know about your experience today. Thank you! ? ? ?

## 2021-07-25 NOTE — Patient Instructions (Signed)
I had a long discussion with the patient and her mother regarding her cerebral venous sinus thrombosis and discussed results of follow-up MRI scan and MRV and answered questions.  Continue Eliquis for a total of 9 months since her cerebral venous sinus thrombosis in September 2022 and then will switch to aspirin.  She was advised to keep herself adequately hydrated.  We will schedule follow-up MRV prior to next visit in 6 months.

## 2021-07-26 ENCOUNTER — Other Ambulatory Visit: Payer: Self-pay | Admitting: Obstetrics and Gynecology

## 2021-07-26 ENCOUNTER — Encounter: Payer: Self-pay | Admitting: Obstetrics and Gynecology

## 2021-07-26 DIAGNOSIS — Z30011 Encounter for initial prescription of contraceptive pills: Secondary | ICD-10-CM

## 2021-07-26 MED ORDER — NORETHINDRONE 0.35 MG PO TABS: 1.0000 | ORAL_TABLET | Freq: Every day | ORAL | 3 refills | Status: DC

## 2021-07-26 NOTE — Progress Notes (Signed)
Rx POPs to [employer]

## 2021-07-30 ENCOUNTER — Other Ambulatory Visit: Payer: Self-pay

## 2021-07-30 LAB — CHLAMYDIA/GONOCOCCUS/TRICHOMONAS, NAA
Chlamydia by NAA: NEGATIVE
Gonococcus by NAA: NEGATIVE
Trich vag by NAA: NEGATIVE

## 2021-07-30 NOTE — Progress Notes (Signed)
Per Staff message 07/30/2021 from Lurline Hare, pt's insurance will cover Venofer.  Verbal order from Dr. Mosetta Putt given for Venofer 300mg  x3 treatments.  Supportive treatment plan updated and scheduling sent message to schedule last appt for 08/14/2021.  Infusion updated on the change in treatment.

## 2021-07-31 ENCOUNTER — Other Ambulatory Visit: Payer: Self-pay

## 2021-07-31 ENCOUNTER — Inpatient Hospital Stay: Payer: Managed Care, Other (non HMO)

## 2021-07-31 VITALS — BP 121/71 | HR 69 | Temp 97.9°F | Resp 18

## 2021-07-31 DIAGNOSIS — D500 Iron deficiency anemia secondary to blood loss (chronic): Secondary | ICD-10-CM

## 2021-07-31 DIAGNOSIS — D509 Iron deficiency anemia, unspecified: Secondary | ICD-10-CM | POA: Diagnosis not present

## 2021-07-31 DIAGNOSIS — E538 Deficiency of other specified B group vitamins: Secondary | ICD-10-CM

## 2021-07-31 MED ORDER — ACETAMINOPHEN 325 MG PO TABS
650.0000 mg | ORAL_TABLET | Freq: Once | ORAL | Status: AC
  Administered 2021-07-31: 650 mg via ORAL
  Filled 2021-07-31: qty 2

## 2021-07-31 MED ORDER — SODIUM CHLORIDE 0.9 % IV SOLN: Freq: Once | INTRAVENOUS | Status: AC

## 2021-07-31 MED ORDER — DIPHENHYDRAMINE HCL 25 MG PO CAPS
25.0000 mg | ORAL_CAPSULE | Freq: Once | ORAL | Status: AC
  Administered 2021-07-31: 25 mg via ORAL
  Filled 2021-07-31: qty 1

## 2021-07-31 MED ORDER — CYANOCOBALAMIN 1000 MCG/ML IJ SOLN
1000.0000 ug | Freq: Once | INTRAMUSCULAR | Status: AC
  Administered 2021-07-31: 1000 ug via INTRAMUSCULAR
  Filled 2021-07-31: qty 1

## 2021-07-31 MED ORDER — SODIUM CHLORIDE 0.9 % IV SOLN
300.0000 mg | Freq: Once | INTRAVENOUS | Status: AC
  Administered 2021-07-31: 300 mg via INTRAVENOUS
  Filled 2021-07-31: qty 300

## 2021-07-31 NOTE — Patient Instructions (Signed)

## 2021-07-31 NOTE — Progress Notes (Signed)
Pt observed for 30 minutes post iron infusion. No complications. VSS. Pt ambulatory at discharge.

## 2021-08-02 ENCOUNTER — Encounter: Payer: Self-pay | Admitting: Obstetrics and Gynecology

## 2021-08-05 ENCOUNTER — Encounter: Payer: Self-pay | Admitting: Neurology

## 2021-08-06 ENCOUNTER — Telehealth: Payer: Self-pay | Admitting: *Deleted

## 2021-08-06 NOTE — Telephone Encounter (Signed)
Converted to phone note.

## 2021-08-06 NOTE — Telephone Encounter (Signed)
Mychart message received from patient:  Hi, Tammy Weber been experiencing some new and weird abdominal pain. I do not know if its or serious or not but it has had me thinking. Honestly I kind of what to just get the surgery to remove my blood clot because all of this treatment really scares me and I really do not like being alone in this process so please let me know your thoughts. Thank you!

## 2021-08-07 ENCOUNTER — Other Ambulatory Visit: Payer: Self-pay

## 2021-08-07 ENCOUNTER — Inpatient Hospital Stay: Payer: Managed Care, Other (non HMO)

## 2021-08-07 ENCOUNTER — Inpatient Hospital Stay: Payer: Managed Care, Other (non HMO) | Attending: Hematology

## 2021-08-07 VITALS — BP 122/69 | HR 69 | Temp 98.1°F | Resp 16

## 2021-08-07 DIAGNOSIS — Z7901 Long term (current) use of anticoagulants: Secondary | ICD-10-CM | POA: Insufficient documentation

## 2021-08-07 DIAGNOSIS — D509 Iron deficiency anemia, unspecified: Secondary | ICD-10-CM | POA: Diagnosis not present

## 2021-08-07 DIAGNOSIS — G08 Intracranial and intraspinal phlebitis and thrombophlebitis: Secondary | ICD-10-CM | POA: Diagnosis not present

## 2021-08-07 DIAGNOSIS — E538 Deficiency of other specified B group vitamins: Secondary | ICD-10-CM

## 2021-08-07 DIAGNOSIS — D500 Iron deficiency anemia secondary to blood loss (chronic): Secondary | ICD-10-CM

## 2021-08-07 LAB — CBC WITH DIFFERENTIAL/PLATELET
Abs Immature Granulocytes: 0.02 10*3/uL (ref 0.00–0.07)
Basophils Absolute: 0 10*3/uL (ref 0.0–0.1)
Basophils Relative: 1 %
Eosinophils Absolute: 0.1 10*3/uL (ref 0.0–0.5)
Eosinophils Relative: 1 %
HCT: 40.2 % (ref 36.0–46.0)
Hemoglobin: 13 g/dL (ref 12.0–15.0)
Immature Granulocytes: 0 %
Lymphocytes Relative: 24 %
Lymphs Abs: 1.5 10*3/uL (ref 0.7–4.0)
MCH: 25.6 pg — ABNORMAL LOW (ref 26.0–34.0)
MCHC: 32.3 g/dL (ref 30.0–36.0)
MCV: 79.3 fL — ABNORMAL LOW (ref 80.0–100.0)
Monocytes Absolute: 0.5 10*3/uL (ref 0.1–1.0)
Monocytes Relative: 8 %
Neutro Abs: 4.2 10*3/uL (ref 1.7–7.7)
Neutrophils Relative %: 66 %
Platelets: 252 10*3/uL (ref 150–400)
RBC: 5.07 MIL/uL (ref 3.87–5.11)
RDW: 16.9 % — ABNORMAL HIGH (ref 11.5–15.5)
WBC: 6.3 10*3/uL (ref 4.0–10.5)
nRBC: 0 % (ref 0.0–0.2)

## 2021-08-07 LAB — IRON AND IRON BINDING CAPACITY (CC-WL,HP ONLY)
Iron: 69 ug/dL (ref 28–170)
Saturation Ratios: 15 % (ref 10.4–31.8)
TIBC: 455 ug/dL — ABNORMAL HIGH (ref 250–450)
UIBC: 386 ug/dL (ref 148–442)

## 2021-08-07 LAB — VITAMIN B12: Vitamin B-12: 625 pg/mL (ref 180–914)

## 2021-08-07 LAB — FERRITIN: Ferritin: 102 ng/mL (ref 11–307)

## 2021-08-07 MED ORDER — SODIUM CHLORIDE 0.9 % IV SOLN
300.0000 mg | Freq: Once | INTRAVENOUS | Status: AC
  Administered 2021-08-07: 300 mg via INTRAVENOUS
  Filled 2021-08-07: qty 300

## 2021-08-07 MED ORDER — ACETAMINOPHEN 325 MG PO TABS
650.0000 mg | ORAL_TABLET | Freq: Once | ORAL | Status: AC
  Administered 2021-08-07: 650 mg via ORAL
  Filled 2021-08-07: qty 2

## 2021-08-07 MED ORDER — DIPHENHYDRAMINE HCL 25 MG PO CAPS
25.0000 mg | ORAL_CAPSULE | Freq: Once | ORAL | Status: AC
  Administered 2021-08-07: 25 mg via ORAL
  Filled 2021-08-07: qty 1

## 2021-08-07 MED ORDER — SODIUM CHLORIDE 0.9 % IV SOLN: Freq: Once | INTRAVENOUS | Status: AC

## 2021-08-07 NOTE — Patient Instructions (Signed)

## 2021-08-08 NOTE — Progress Notes (Signed)
Pt declined to stay for 30 min observation post iron transfusion. Tolerated well, d/c'ed in stable  condition ambulatory to lobby

## 2021-08-11 ENCOUNTER — Encounter: Payer: Self-pay | Admitting: Hematology

## 2021-08-14 ENCOUNTER — Inpatient Hospital Stay: Payer: Managed Care, Other (non HMO)

## 2021-08-14 ENCOUNTER — Other Ambulatory Visit: Payer: Self-pay

## 2021-08-14 VITALS — BP 114/62 | HR 75 | Temp 98.5°F | Resp 17

## 2021-08-14 DIAGNOSIS — D500 Iron deficiency anemia secondary to blood loss (chronic): Secondary | ICD-10-CM

## 2021-08-14 DIAGNOSIS — D509 Iron deficiency anemia, unspecified: Secondary | ICD-10-CM | POA: Diagnosis not present

## 2021-08-14 LAB — CBC WITH DIFFERENTIAL/PLATELET
Abs Immature Granulocytes: 0.01 10*3/uL (ref 0.00–0.07)
Basophils Absolute: 0 10*3/uL (ref 0.0–0.1)
Basophils Relative: 1 %
Eosinophils Absolute: 0 10*3/uL (ref 0.0–0.5)
Eosinophils Relative: 0 %
HCT: 42.5 % (ref 36.0–46.0)
Hemoglobin: 13.3 g/dL (ref 12.0–15.0)
Immature Granulocytes: 0 %
Lymphocytes Relative: 22 %
Lymphs Abs: 1.4 10*3/uL (ref 0.7–4.0)
MCH: 25.8 pg — ABNORMAL LOW (ref 26.0–34.0)
MCHC: 31.3 g/dL (ref 30.0–36.0)
MCV: 82.5 fL (ref 80.0–100.0)
Monocytes Absolute: 0.4 10*3/uL (ref 0.1–1.0)
Monocytes Relative: 5 %
Neutro Abs: 4.7 10*3/uL (ref 1.7–7.7)
Neutrophils Relative %: 72 %
Platelets: 214 10*3/uL (ref 150–400)
RBC: 5.15 MIL/uL — ABNORMAL HIGH (ref 3.87–5.11)
RDW: 18.9 % — ABNORMAL HIGH (ref 11.5–15.5)
WBC: 6.6 10*3/uL (ref 4.0–10.5)
nRBC: 0 % (ref 0.0–0.2)

## 2021-08-14 LAB — FERRITIN: Ferritin: 147 ng/mL (ref 11–307)

## 2021-08-14 MED ORDER — SODIUM CHLORIDE 0.9 % IV SOLN: Freq: Once | INTRAVENOUS | Status: AC

## 2021-08-14 MED ORDER — ACETAMINOPHEN 325 MG PO TABS
650.0000 mg | ORAL_TABLET | Freq: Once | ORAL | Status: AC
  Administered 2021-08-14: 650 mg via ORAL
  Filled 2021-08-14: qty 2

## 2021-08-14 MED ORDER — SODIUM CHLORIDE 0.9 % IV SOLN
300.0000 mg | Freq: Once | INTRAVENOUS | Status: AC
  Administered 2021-08-14: 300 mg via INTRAVENOUS
  Filled 2021-08-14: qty 300

## 2021-08-14 MED ORDER — DIPHENHYDRAMINE HCL 25 MG PO CAPS
25.0000 mg | ORAL_CAPSULE | Freq: Once | ORAL | Status: AC
  Administered 2021-08-14: 25 mg via ORAL
  Filled 2021-08-14: qty 1

## 2021-08-14 NOTE — Patient Instructions (Signed)

## 2021-08-15 ENCOUNTER — Telehealth: Payer: Self-pay | Admitting: Hematology

## 2021-08-15 LAB — IRON AND IRON BINDING CAPACITY (CC-WL,HP ONLY)
Iron: 162 ug/dL (ref 28–170)
Saturation Ratios: 38 % — ABNORMAL HIGH (ref 10.4–31.8)
TIBC: 431 ug/dL (ref 250–450)
UIBC: 269 ug/dL (ref 148–442)

## 2021-08-15 NOTE — Telephone Encounter (Signed)
Left message with rescheduled upcoming appointment due to provider's breast clinic. 

## 2021-08-24 ENCOUNTER — Encounter: Payer: Self-pay | Admitting: Hematology

## 2021-08-24 ENCOUNTER — Inpatient Hospital Stay: Payer: Managed Care, Other (non HMO) | Admitting: Hematology

## 2021-08-24 ENCOUNTER — Other Ambulatory Visit: Payer: Self-pay

## 2021-08-24 ENCOUNTER — Inpatient Hospital Stay (HOSPITAL_BASED_OUTPATIENT_CLINIC_OR_DEPARTMENT_OTHER): Payer: Managed Care, Other (non HMO)

## 2021-08-24 VITALS — BP 113/72 | HR 62 | Temp 97.9°F | Resp 16 | Wt 159.4 lb

## 2021-08-24 DIAGNOSIS — D500 Iron deficiency anemia secondary to blood loss (chronic): Secondary | ICD-10-CM

## 2021-08-24 DIAGNOSIS — E538 Deficiency of other specified B group vitamins: Secondary | ICD-10-CM | POA: Diagnosis not present

## 2021-08-24 DIAGNOSIS — D509 Iron deficiency anemia, unspecified: Secondary | ICD-10-CM | POA: Diagnosis not present

## 2021-08-24 LAB — CBC WITH DIFFERENTIAL/PLATELET
Abs Immature Granulocytes: 0.01 10*3/uL (ref 0.00–0.07)
Basophils Absolute: 0 10*3/uL (ref 0.0–0.1)
Basophils Relative: 1 %
Eosinophils Absolute: 0 10*3/uL (ref 0.0–0.5)
Eosinophils Relative: 0 %
HCT: 44.1 % (ref 36.0–46.0)
Hemoglobin: 14.2 g/dL (ref 12.0–15.0)
Immature Granulocytes: 0 %
Lymphocytes Relative: 40 %
Lymphs Abs: 2.2 10*3/uL (ref 0.7–4.0)
MCH: 26.8 pg (ref 26.0–34.0)
MCHC: 32.2 g/dL (ref 30.0–36.0)
MCV: 83.4 fL (ref 80.0–100.0)
Monocytes Absolute: 0.4 10*3/uL (ref 0.1–1.0)
Monocytes Relative: 7 %
Neutro Abs: 2.9 10*3/uL (ref 1.7–7.7)
Neutrophils Relative %: 52 %
Platelets: 282 10*3/uL (ref 150–400)
RBC: 5.29 MIL/uL — ABNORMAL HIGH (ref 3.87–5.11)
RDW: 20.2 % — ABNORMAL HIGH (ref 11.5–15.5)
WBC: 5.5 10*3/uL (ref 4.0–10.5)
nRBC: 0 % (ref 0.0–0.2)

## 2021-08-24 LAB — FERRITIN: Ferritin: 149 ng/mL (ref 11–307)

## 2021-08-24 LAB — IRON AND IRON BINDING CAPACITY (CC-WL,HP ONLY)
Iron: 55 ug/dL (ref 28–170)
Saturation Ratios: 13 % (ref 10.4–31.8)
TIBC: 417 ug/dL (ref 250–450)
UIBC: 362 ug/dL (ref 148–442)

## 2021-08-24 NOTE — Progress Notes (Signed)
[facility]   [phone] [phone]   Clinic Follow up Note   Patient Care Team: Pcp, No as PCP - General  Date of Service:  08/24/2021  CHIEF COMPLAINT: f/u of cerebral venous sinus thrombosis  CURRENT THERAPY:  Eliquis, started 04/02/21  ASSESSMENT & PLAN:  Tammy Weber is a 21 y.o. female with   1. 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 CT head venogram confirmed right transverse and sigmoid sinus thrombosis. She was discharged with 5 days of Lovenox then oral Pradaxa. Birth control pills were discontinued at that time. -returned 03/29/21 with worsening headache with muffled hearing. Brain MRI showed mild progression of CVST. -she received heparin infusion while in the hospital. She was discharged on Eliquis with loading dose. She is tolerating well  -repeat brain MRI/MRV on 07/04/21 showed improved flow signal. Repeat planned for 12/2021. -Plan to continue Eliquis on through end of May or June 2023, will do hypercoagulopathy work-up 1 month after off anticoagulation (she had negative factor V Leiden mutation, prothrombin gene mutation, beta-2 glycoprotein and cardiolipin antibodies, will not repeat next time)    2. Anemia, iron deficiency and B12 deficiency -she received two doses of ferric gluconate and one B12 while in the hospital, then two doses of B12 and three doses of IV Venofer after discharge. -she notes increasing periods since starting Eliquis, requiring her to change pad every hour, as well as gum bleeding while brushing her teeth. These should resolve once she is off Eliquis. -anemia resolved, hgb 14.2 today (08/24/21). Ferritin and iron today are pending, but were WNL on 08/14/21. -I do not feel she will need additional IV iron as she has responded well, but we will wait for lab results.     PLAN: -continue Eliquis, OK to hold for a few days at the beginning of menstruation   -lab every 6 weeks x3, will give iv venofer if ferritin <20 -f/u in 18 weeks -start oral B12 1036mcg daily    No problem-specific Assessment & Plan notes found for this encounter.   INTERVAL HISTORY:  Tammy Weber is here for a follow up of cerebral venous sinus thrombosis. She was last seen by me on 05/01/21. She presents to the clinic accompanied by her mother. She reports she did not notice a significant change after iron infusion. She notes she had some more energy, but she is unsure if this is due to the iron or the B12. She reports her periods are much heavier since being on the Eliquis, requiring her to change her pad every hour for three days. She also reports gum bleeding when she brushes her teeth. She also reports worsening circulation; she notes a few fingers or toes will turn blue if she gets cold. She notes it is not painful or bothersome.   All other systems were reviewed with the patient and are negative.  MEDICAL HISTORY:  Past Medical History:  Diagnosis Date   Chronic cerebral venous infarction ([facility])    Vaccine for human papilloma virus (HPV) types 6, 11, 16, and 18 administered     SURGICAL HISTORY: Past Surgical History:  Procedure Laterality Date   NO PAST SURGERIES      I have reviewed the social history and family history with the patient and they are unchanged from previous note.  ALLERGIES:  is allergic to latex.  MEDICATIONS:  Current Outpatient Medications  Medication Sig Dispense Refill   apixaban (ELIQUIS)  5 MG TABS tablet Take 1 tablet (5 mg total) by mouth 2 (two) times daily. take 5 mg po twice daily. 180 tablet 2   norethindrone (MICRONOR) 0.35 MG tablet Take 1 tablet (0.35 mg total) by mouth daily. 84 tablet 3   topiramate (TOPAMAX) 25 MG tablet Take 1 tablet (25 mg total) by mouth 2 (two) times daily. 60 tablet 2   No current facility-administered medications for this visit.    PHYSICAL EXAMINATION: ECOG PERFORMANCE STATUS: 0 -  Asymptomatic  Vitals:   08/24/21 0937  BP: 113/72  Pulse: 62  Resp: 16  Temp: 97.9 F (36.6 C)  SpO2: 99%   Wt Readings from Last 3 Encounters:  08/24/21 159 lb 6 oz (72.3 kg)  07/25/21 163 lb (73.9 kg)  07/25/21 162 lb (73.5 kg)     GENERAL:alert, no distress and comfortable SKIN: skin color normal, no rashes or significant lesions EYES: normal, Conjunctiva are pink and non-injected, sclera clear  NEURO: alert & oriented x 3 with fluent speech  LABORATORY DATA:  I have reviewed the data as listed CBC Latest Ref Rng & Units 08/24/2021 08/14/2021 08/07/2021  WBC 4.0 - 10.5 K/uL 5.5 6.6 6.3  Hemoglobin 12.0 - 15.0 g/dL 14.2 13.3 13.0  Hematocrit 36.0 - 46.0 % 44.1 42.5 40.2  Platelets 150 - 400 K/uL 282 214 252     CMP Latest Ref Rng & Units 03/30/2021 03/29/2021 03/27/2021  Glucose 70 - 99 mg/dL 94 99 95  BUN 6 - 20 mg/dL 5(L) 7 6  Creatinine 0.44 - 1.00 mg/dL 0.77 0.91 0.82  Sodium 135 - 145 mmol/L 137 138 134(L)  Potassium 3.5 - 5.1 mmol/L 4.1 4.1 3.9  Chloride 98 - 111 mmol/L 108 104 103  CO2 22 - 32 mmol/L 21(L) 24 22  Calcium 8.9 - 10.3 mg/dL 8.9 9.5 9.1  Total Protein 6.5 - 8.1 g/dL - 7.5 6.8  Total Bilirubin 0.3 - 1.2 mg/dL - 0.2(L) 0.8  Alkaline Phos 38 - 126 U/L - 58 63  AST 15 - 41 U/L - 16 15  ALT 0 - 44 U/L - 12 11      RADIOGRAPHIC STUDIES: I have personally reviewed the radiological images as listed and agreed with the findings in the report. No results found.    No orders of the defined types were placed in this encounter.  All questions were answered. The patient knows to call the clinic with any problems, questions or concerns. No barriers to learning was detected. The total time spent in the appointment was 20 minutes.     Truitt Merle, MD 08/24/2021   I, Wilburn Mylar, am acting as scribe for Truitt Merle, MD.   I have reviewed the above documentation for accuracy and completeness, and I agree with the above.

## 2021-08-25 ENCOUNTER — Encounter: Payer: Self-pay | Admitting: Hematology

## 2021-08-27 ENCOUNTER — Telehealth: Payer: Self-pay | Admitting: Hematology

## 2021-08-27 NOTE — Telephone Encounter (Signed)
Left message with follow-up appointments per 2/24 los. °

## 2021-08-29 ENCOUNTER — Inpatient Hospital Stay: Payer: Managed Care, Other (non HMO)

## 2021-08-29 ENCOUNTER — Inpatient Hospital Stay: Payer: Managed Care, Other (non HMO) | Admitting: Hematology

## 2021-10-05 ENCOUNTER — Inpatient Hospital Stay: Payer: Managed Care, Other (non HMO) | Attending: Hematology

## 2021-10-05 ENCOUNTER — Other Ambulatory Visit: Payer: Self-pay

## 2021-10-05 DIAGNOSIS — D509 Iron deficiency anemia, unspecified: Secondary | ICD-10-CM | POA: Diagnosis present

## 2021-10-05 DIAGNOSIS — D500 Iron deficiency anemia secondary to blood loss (chronic): Secondary | ICD-10-CM

## 2021-10-05 LAB — CBC WITH DIFFERENTIAL/PLATELET
Abs Immature Granulocytes: 0.02 10*3/uL (ref 0.00–0.07)
Basophils Absolute: 0 10*3/uL (ref 0.0–0.1)
Basophils Relative: 1 %
Eosinophils Absolute: 0.1 10*3/uL (ref 0.0–0.5)
Eosinophils Relative: 2 %
HCT: 45.8 % (ref 36.0–46.0)
Hemoglobin: 15.4 g/dL — ABNORMAL HIGH (ref 12.0–15.0)
Immature Granulocytes: 0 %
Lymphocytes Relative: 39 %
Lymphs Abs: 3.1 10*3/uL (ref 0.7–4.0)
MCH: 29 pg (ref 26.0–34.0)
MCHC: 33.6 g/dL (ref 30.0–36.0)
MCV: 86.3 fL (ref 80.0–100.0)
Monocytes Absolute: 0.4 10*3/uL (ref 0.1–1.0)
Monocytes Relative: 5 %
Neutro Abs: 4.3 10*3/uL (ref 1.7–7.7)
Neutrophils Relative %: 53 %
Platelets: 235 10*3/uL (ref 150–400)
RBC: 5.31 MIL/uL — ABNORMAL HIGH (ref 3.87–5.11)
RDW: 16.5 % — ABNORMAL HIGH (ref 11.5–15.5)
WBC: 8 10*3/uL (ref 4.0–10.5)
nRBC: 0 % (ref 0.0–0.2)

## 2021-10-05 LAB — IRON AND IRON BINDING CAPACITY (CC-WL,HP ONLY)
Iron: 49 ug/dL (ref 28–170)
Saturation Ratios: 11 % (ref 10.4–31.8)
TIBC: 431 ug/dL (ref 250–450)
UIBC: 382 ug/dL (ref 148–442)

## 2021-10-05 LAB — FERRITIN: Ferritin: 64 ng/mL (ref 11–307)

## 2021-10-05 NOTE — Progress Notes (Signed)
Please let pt know her iron level is good, no need iv iron for now, thanks  ? ?YF

## 2021-11-16 ENCOUNTER — Inpatient Hospital Stay: Payer: Managed Care, Other (non HMO) | Attending: Hematology

## 2021-11-16 ENCOUNTER — Other Ambulatory Visit: Payer: Self-pay

## 2021-11-16 DIAGNOSIS — D500 Iron deficiency anemia secondary to blood loss (chronic): Secondary | ICD-10-CM

## 2021-11-16 DIAGNOSIS — E538 Deficiency of other specified B group vitamins: Secondary | ICD-10-CM

## 2021-11-16 DIAGNOSIS — D509 Iron deficiency anemia, unspecified: Secondary | ICD-10-CM | POA: Diagnosis present

## 2021-11-16 LAB — IRON AND IRON BINDING CAPACITY (CC-WL,HP ONLY)
Iron: 64 ug/dL (ref 28–170)
Saturation Ratios: 15 % (ref 10.4–31.8)
TIBC: 441 ug/dL (ref 250–450)
UIBC: 377 ug/dL (ref 148–442)

## 2021-11-16 LAB — CBC WITH DIFFERENTIAL/PLATELET
Abs Immature Granulocytes: 0.02 10*3/uL (ref 0.00–0.07)
Basophils Absolute: 0 10*3/uL (ref 0.0–0.1)
Basophils Relative: 0 %
Eosinophils Absolute: 0.1 10*3/uL (ref 0.0–0.5)
Eosinophils Relative: 2 %
HCT: 44.5 % (ref 36.0–46.0)
Hemoglobin: 15 g/dL (ref 12.0–15.0)
Immature Granulocytes: 0 %
Lymphocytes Relative: 31 %
Lymphs Abs: 2.1 10*3/uL (ref 0.7–4.0)
MCH: 30.9 pg (ref 26.0–34.0)
MCHC: 33.7 g/dL (ref 30.0–36.0)
MCV: 91.6 fL (ref 80.0–100.0)
Monocytes Absolute: 0.5 10*3/uL (ref 0.1–1.0)
Monocytes Relative: 7 %
Neutro Abs: 4 10*3/uL (ref 1.7–7.7)
Neutrophils Relative %: 60 %
Platelets: 226 10*3/uL (ref 150–400)
RBC: 4.86 MIL/uL (ref 3.87–5.11)
RDW: 13.1 % (ref 11.5–15.5)
WBC: 6.7 10*3/uL (ref 4.0–10.5)
nRBC: 0 % (ref 0.0–0.2)

## 2021-11-16 LAB — VITAMIN B12: Vitamin B-12: 1007 pg/mL — ABNORMAL HIGH (ref 180–914)

## 2021-11-16 LAB — FERRITIN: Ferritin: 26 ng/mL (ref 11–307)

## 2021-11-18 ENCOUNTER — Encounter: Payer: Self-pay | Admitting: Hematology

## 2021-12-08 ENCOUNTER — Encounter: Payer: Self-pay | Admitting: Neurology

## 2021-12-12 ENCOUNTER — Other Ambulatory Visit: Payer: Self-pay | Admitting: Neurology

## 2021-12-12 DIAGNOSIS — G08 Intracranial and intraspinal phlebitis and thrombophlebitis: Secondary | ICD-10-CM

## 2021-12-14 ENCOUNTER — Telehealth: Payer: Self-pay | Admitting: Neurology

## 2021-12-14 NOTE — Telephone Encounter (Signed)
[employer] sent to GI they obtain auth and will call the patient to schedule 

## 2021-12-17 ENCOUNTER — Other Ambulatory Visit: Payer: Self-pay | Admitting: Neurology

## 2021-12-17 DIAGNOSIS — G08 Intracranial and intraspinal phlebitis and thrombophlebitis: Secondary | ICD-10-CM

## 2021-12-23 ENCOUNTER — Ambulatory Visit
Admission: RE | Admit: 2021-12-23 | Discharge: 2021-12-23 | Disposition: A | Discharge status: Home / Self Care | Payer: Managed Care, Other (non HMO) | Source: Ambulatory Visit | Attending: Neurology | Admitting: Neurology

## 2021-12-23 DIAGNOSIS — G08 Intracranial and intraspinal phlebitis and thrombophlebitis: Secondary | ICD-10-CM

## 2021-12-24 ENCOUNTER — Telehealth: Payer: Self-pay | Admitting: *Deleted

## 2021-12-24 NOTE — Telephone Encounter (Signed)
I spoke to the patient. She verbalized understanding of the MRV results and medication plan.

## 2021-12-28 ENCOUNTER — Inpatient Hospital Stay: Payer: Managed Care, Other (non HMO) | Attending: Hematology | Admitting: Hematology

## 2021-12-28 ENCOUNTER — Other Ambulatory Visit: Payer: Self-pay

## 2021-12-28 ENCOUNTER — Inpatient Hospital Stay: Payer: Managed Care, Other (non HMO)

## 2021-12-28 VITALS — BP 121/75 | HR 100 | Temp 98.4°F | Resp 16 | Wt 176.0 lb

## 2021-12-28 DIAGNOSIS — D509 Iron deficiency anemia, unspecified: Secondary | ICD-10-CM | POA: Insufficient documentation

## 2021-12-28 DIAGNOSIS — E538 Deficiency of other specified B group vitamins: Secondary | ICD-10-CM | POA: Diagnosis not present

## 2021-12-28 DIAGNOSIS — D500 Iron deficiency anemia secondary to blood loss (chronic): Secondary | ICD-10-CM

## 2021-12-28 DIAGNOSIS — Z7901 Long term (current) use of anticoagulants: Secondary | ICD-10-CM | POA: Insufficient documentation

## 2021-12-28 DIAGNOSIS — G08 Intracranial and intraspinal phlebitis and thrombophlebitis: Secondary | ICD-10-CM

## 2021-12-28 LAB — IRON AND IRON BINDING CAPACITY (CC-WL,HP ONLY)
Iron: 40 ug/dL (ref 28–170)
Saturation Ratios: 11 % (ref 10.4–31.8)
TIBC: 363 ug/dL (ref 250–450)
UIBC: 323 ug/dL (ref 148–442)

## 2021-12-28 LAB — CBC WITH DIFFERENTIAL/PLATELET
Abs Immature Granulocytes: 0.01 10*3/uL (ref 0.00–0.07)
Basophils Absolute: 0 10*3/uL (ref 0.0–0.1)
Basophils Relative: 1 %
Eosinophils Absolute: 0.1 10*3/uL (ref 0.0–0.5)
Eosinophils Relative: 2 %
HCT: 43.3 % (ref 36.0–46.0)
Hemoglobin: 14.7 g/dL (ref 12.0–15.0)
Immature Granulocytes: 0 %
Lymphocytes Relative: 30 %
Lymphs Abs: 2 10*3/uL (ref 0.7–4.0)
MCH: 31.1 pg (ref 26.0–34.0)
MCHC: 33.9 g/dL (ref 30.0–36.0)
MCV: 91.7 fL (ref 80.0–100.0)
Monocytes Absolute: 0.4 10*3/uL (ref 0.1–1.0)
Monocytes Relative: 6 %
Neutro Abs: 4.1 10*3/uL (ref 1.7–7.7)
Neutrophils Relative %: 61 %
Platelets: 230 10*3/uL (ref 150–400)
RBC: 4.72 MIL/uL (ref 3.87–5.11)
RDW: 11.9 % (ref 11.5–15.5)
WBC: 6.6 10*3/uL (ref 4.0–10.5)
nRBC: 0 % (ref 0.0–0.2)

## 2021-12-28 LAB — FERRITIN: Ferritin: 26 ng/mL (ref 11–307)

## 2021-12-28 NOTE — Progress Notes (Signed)
[facility]   [phone] Fax:(336) [phone]   Clinic Follow up Note   Patient Care Team: Pcp, No as PCP - General  Date of Service:  12/28/2021  CHIEF COMPLAINT: f/u of cerebral venous sinus thrombosis  CURRENT THERAPY:  Eliquis, started 04/02/21  ASSESSMENT & PLAN:  Tammy Weber is a 21 y.o. female with   1. 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 CT head venogram confirmed right transverse and sigmoid sinus thrombosis. She was discharged with 5 days of Lovenox then oral Pradaxa. Birth control pills were discontinued at that time. -returned 03/29/21 with worsening headache with muffled hearing. Brain MRI showed mild progression of CVST. -she received heparin infusion while in the hospital. She was discharged on Eliquis with loading dose. She is tolerating well  -she had negative factor V Leiden mutation, prothrombin gene mutation, beta-2 glycoprotein and cardiolipin antibodies -repeat brain MRI/MRV on 07/18/2021 showed improved flow signal. Repeat on 12/23/21 showed no change from 07/2021. She will continue Eliquis for now, per Dr. Pearlean Brownie. -labs reviewed, CBC is WNL. Iron and ferritin are pending.   2. Anemia, iron deficiency and B12 deficiency -she received two doses of ferric gluconate and one B12 while in the hospital, then two doses of B12 and three doses of IV Venofer after discharge. -she notes increasing periods since starting Eliquis, requiring her to change pad every hour, as well as gum bleeding while brushing her teeth. I recommend she hold the eliquis the first few days of her period. -I recommend IV iron if her ferritin is <10.     PLAN: -continue Eliquis, OK to hold for a few days at the beginning of menstruation  -lab every 2 months x3 -f/u in 6 months   No problem-specific Assessment & Plan notes found for this encounter.   INTERVAL HISTORY:  Tammy Weber is here for a follow  up of thrombosis. She was last seen by me on 08/24/21. She presents to the clinic accompanied by her mother. She denies recurrent headaches. She reports her periods remains very heavy because of the eliquis.   All other systems were reviewed with the patient and are negative.  MEDICAL HISTORY:  Past Medical History:  Diagnosis Date   Chronic cerebral venous infarction ([facility])    Vaccine for human papilloma virus (HPV) types 6, 11, 16, and 18 administered     SURGICAL HISTORY: Past Surgical History:  Procedure Laterality Date   NO PAST SURGERIES      I have reviewed the social history and family history with the patient and they are unchanged from previous note.  ALLERGIES:  is allergic to latex.  MEDICATIONS:  Current Outpatient Medications  Medication Sig Dispense Refill   apixaban (ELIQUIS) 5 MG TABS tablet Take 1 tablet (5 mg total) by mouth 2 (two) times daily. take 5 mg po twice daily. 180 tablet 2   norethindrone (MICRONOR) 0.35 MG tablet Take 1 tablet (0.35 mg total) by mouth daily. 84 tablet 3   topiramate (TOPAMAX) 25 MG tablet Take 1 tablet (25 mg total) by mouth 2 (two) times daily. 60 tablet 2   No current facility-administered medications for this visit.    PHYSICAL EXAMINATION: ECOG PERFORMANCE STATUS: {CHL ONC ECOG TF:[id number]}  Vitals:   12/28/21 1450  BP: 121/75  Pulse: 100  Resp: 16  Temp: 98.4 F (36.9 C)  SpO2: 100%   Wt Readings from Last 3 Encounters:  12/28/21 176 lb (  79.8 kg)  08/24/21 159 lb 6 oz (72.3 kg)  07/25/21 163 lb (73.9 kg)     GENERAL:alert, no distress and comfortable SKIN: skin color normal, no rashes or significant lesions EYES: normal, Conjunctiva are pink and non-injected, sclera clear  NEURO: alert & oriented x 3 with fluent speech  LABORATORY DATA:  I have reviewed the data as listed    Latest Ref Rng & Units 12/28/2021    2:35 PM 11/16/2021    9:13 AM 10/05/2021    8:56 AM  CBC  WBC 4.0 - 10.5 K/uL 6.6  6.7  8.0    Hemoglobin 12.0 - 15.0 g/dL 37.1  06.2  69.4   Hematocrit 36.0 - 46.0 % 43.3  44.5  45.8   Platelets 150 - 400 K/uL 230  226  235         Latest Ref Rng & Units 03/30/2021    4:14 AM 03/29/2021    5:24 PM 03/27/2021    1:05 PM  CMP  Glucose 70 - 99 mg/dL 94  99  95   BUN 6 - 20 mg/dL 5  7  6    Creatinine 0.44 - 1.00 mg/dL  8.54  6.27   Sodium 135 - 145 mmol/L 137  138  134   Potassium 3.5 - 5.1 mmol/L 4.1  4.1  3.9   Chloride 98 - 111 mmol/L 108  104  103   CO2 22 - 32 mmol/L 21  24  22    Calcium 8.9 - 10.3 mg/dL 8.9  9.5  9.1   Total Protein 6.5 - 8.1 g/dL  7.5  6.8   Total Bilirubin 0.3 - 1.2 mg/dL  0.2  0.8   Alkaline Phos 38 - 126 U/L  58  63   AST 15 - 41 U/L  16  15   ALT 0 - 44 U/L  12  11       RADIOGRAPHIC STUDIES: I have personally reviewed the radiological images as listed and agreed with the findings in the report. No results found.    No orders of the defined types were placed in this encounter.  All questions were answered. The patient knows to call the clinic with any problems, questions or concerns. No barriers to learning was detected. The total time spent in the appointment was {CHL ONC TIME VISIT - 0.35.     , MD 12/28/2021   I, Malachy Mood, am acting as scribe for 12/30/2021, MD.   {Add scribe attestation statement}

## 2021-12-30 ENCOUNTER — Encounter: Payer: Self-pay | Admitting: Hematology

## 2022-01-20 ENCOUNTER — Other Ambulatory Visit: Payer: Self-pay | Admitting: Neurology

## 2022-01-22 ENCOUNTER — Encounter: Payer: Self-pay | Admitting: Neurology

## 2022-01-22 ENCOUNTER — Ambulatory Visit: Payer: Managed Care, Other (non HMO) | Admitting: Neurology

## 2022-01-22 VITALS — BP 113/72 | HR 83 | Ht 68.0 in | Wt 170.0 lb

## 2022-01-22 DIAGNOSIS — G08 Intracranial and intraspinal phlebitis and thrombophlebitis: Secondary | ICD-10-CM | POA: Diagnosis not present

## 2022-01-22 MED ORDER — ASPIRIN 81 MG PO TBEC: 81.0000 mg | DELAYED_RELEASE_TABLET | Freq: Every day | ORAL | 12 refills | Status: AC

## 2022-01-22 NOTE — Progress Notes (Signed)
[facility] [address] [place]. 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  She is accompanied by her mother.  History is obtained from them and review of electronic medical records and I personally reviewed pertinent available imaging films in PACS.  She is a 21 year old female with no significant past medical history who initially presented on 03/29/2021 with worsening headache and muffled hearing.  She described right-sided throbbing headache mostly in the temples and the back of the head for the past 3 weeks associated with some sound sensitivity.  She was seen in the ER on 03/27/2021 and CT scan and CT venogram showed right sigmoid and transverse sinus thrombosis.  Since she had no focal deficits or she was started on low molecular weight subcu heparin 100 mg every 24 hours for 5 days along with Pradaxa 150 mg twice daily and discharged home.  However she returned 2 days later to the ER with worsening headache and pressure in head and muffled hearing like she is underwater on the right.  MRI scan of the brain showed no acute infarct and MR venogram showed occlusive thrombus in the right transverse and sigmoid sinus extending into the jugular bulb and upper portion of jugular vein.  Comparison between the previous CT venogram and the MR venogram was not entirely accurate due to technical differences but the thrombus was felt to have progressed a bit.  Patient was admitted this time and started on IV heparin drip and switched to Eliquis.  Possibility of doing mechanical thrombectomy to decrease clot burden were discussed with the patient and her father with the patient shows not to have  mechanical thrombectomy.  Patient was advised to quit her birth control pills and see her gynecologist to discuss alternative treatment options for her heavy menstruation.  She was referred to hematologist Dr. Annamaria Boots but that appointment has not yet been made.  She had low vitamin B12 level of 177 and was started on 1000 mcg of vitamin B12 and daily.  She also had iron deficiency anemia for which she was given Ferrlecit 50 mg IV 2 doses and discharged on ferrous gluconate 1 tablet daily.  Patient states she is done well since discharge.  She is tolerating Eliquis well without bruising or bleeding.  She feels there is improvement in the pressure in the right ear as well as headaches which have now practically gone.  She still has subjective decreased hearing and has trouble when there are multiple sounds occurring.  She has gone back to college and has no problems with her classes.  She wants to drive again.  She denies any prior history of deep vein thrombosis, pulmonary embolism, sickle cell disease.  There is no family history of blood clots at a young age.  She had no history of smoking cigarettes but does vape occasionally.  She had a Pfizer vaccine on 10/11/2019 but has not had any recent COVID infection Update 07/25/2021 : She returns for follow-up after last visit 3 months ago.  She is accompanied by her mother.  She is doing well.  She denies significant head pressure or decreased hearing today.  She is tolerating Eliquis well with only minor bruising and bleeding if she is  scratches herself.  She is scheduled to undergo iron infusion next week.  She has not yet met with the gynecologist to discuss alternative medication options for her heavy menstrual bleeding.  She denies any other new health problems.  She did undergo a follow-up MR venogram on 07/04/2021 which shows improved but yet diminished flow in the right transverse, sigmoid sinus and jugular vein.  MRI scan of the brain shows no significant brain  abnormalities except decreased flow signal in the right transverse and sigmoid sinuses.  She has no complaints today. Update 01/22/2022 : She returns for follow-up after last visit 6 months ago.  She is accompanied by her mother.  She continues to do well and has not had any neurological symptoms.  She denies significant headaches and in fact has stopped taking the Topamax.  She remains on Eliquis which is tolerating well without bruising or bleeding.  Menstrual cycles have not been particularly heavy with the bleeding.  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  She had follow-up MR venogram of the brain on 12/23/2021 which showed persistent diminished flow in the right transverse, sigmoid sinus and internal jugular vein with partial recanalization and overall no significant change compared with previous MR venogram dated July 05, 2021.  She did follow-up with Dr. Annamaria Boots hematologist a few months ago who also recommended discontinuing Eliquis after 9 to 10 months and then checking hypercoagulable panel labs later.  Patient has no complaints today. ROS:   14 system review of systems is positive for pressure in the ear, decreased hearing, headache, heavy menstrual bleeding all other systems negative  PMH:  Past Medical History:  Diagnosis Date   Chronic cerebral venous infarction (HCC)    Vaccine for human papilloma virus (HPV) types 6, 11, 16, and 18 administered     Social History:  Social History   Socioeconomic History   Marital status: Single    Spouse name: Not on file   Number of children: Not on file   Years of education: Not on file   Highest education level: Not on file  Occupational History   Not on file  Tobacco Use   Smoking status: Never   Smokeless tobacco: Never  Vaping Use   Vaping Use: Never used  Substance and Sexual Activity   Alcohol use: Yes   Drug use: No   Sexual activity: Yes    Birth control/protection: None, Condom  Other Topics Concern   Not on  file  Social History Narrative   Lives on campus at [facility]   Right Handed   Drinks caffeine rarely   Social Determinants of Health   Financial Resource Strain: Not on file  Food Insecurity: Not on file  Transportation Needs: Not on file  Physical Activity: Not on file  Stress: Not on file  Social Connections: Not on file  Intimate Partner Violence: Not on file    Medications:   No current outpatient medications on file prior to visit.   No current facility-administered medications on file prior to visit.    Allergies:   Allergies  Allergen Reactions   Latex Itching    Physical Exam General: well developed, well nourished young Caucasian lady, seated, in no evident distress Head: head normocephalic and atraumatic.  Neck: supple with no carotid or supraclavicular bruits Cardiovascular: regular rate and rhythm, no murmurs Musculoskeletal: no deformity Skin:  no rash/petichiae Vascular:  Normal pulses all extremities Vitals:   01/22/22 1323  BP: 113/72  Pulse:  83   Neurologic Exam Mental Status: Awake and fully alert. Oriented to place and time. Recent and remote memory intact. Attention span, concentration and fund of knowledge appropriate. Mood and affect appropriate.  Cranial Nerves: Fundoscopic exam reveals sharp disc margins. Pupils equal, briskly reactive to light. Extraocular movements full without nystagmus. Visual fields full to confrontation. 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 Facial sensation intact. Face, tongue, palate moves normally and symmetrically.  Motor: Normal bulk and tone. Normal strength in all tested extremity muscles. Sensory.: intact to touch ,pinprick .position and vibratory sensation.  Coordination: Rapid alternating movements normal in all extremities. Finger-to-nose and heel-to-shin performed accurately bilaterally. Gait and Station: Arises from chair without difficulty. Stance is normal. Gait demonstrates  normal stride length and balance . Able to heel, toe and tandem walk without difficulty.  Reflexes: 1+ and symmetric. Toes downgoing.       ASSESSMENT: 22 year old Caucasian lady with right transverse and sigmoid sinus and jugular vein thrombosis likely related to using birth control pills.  She is doing quite well  .     PLAN:I had a long discussion with the patient and her mother regarding her cerebral venous sinus thrombosis and she is doing well on anticoagulation for the last 10 months.  I reviewed the results of the last MR venogram done last month which shows persistent thrombosis of the right transverse sigmoid sinus and jugular vein with partial recanalization.  I recommend she discontinue Eliquis and switch to aspirin 81 mg daily.  We will repeat hypercoagulability panel labs after 1 week to be sure that she does not have any primary hypercoagulable disorder even though the initial lab work at the time of her admission was negative.  I encouraged her to keep herself well-hydrated and drink at least 8 to 10 glasses of liquids a day.  She was also counseled not to gain weight and to avoid birth control pills and smoking cigarettes and voiced understanding.  She will return for follow-up in the future in 6 months or call earlier if necessary. Greater than 50% of time during this 35 minute visit was spent on counseling,explanation of diagnosis of cerebral venous sinus thrombosis, planning of further management, discussion with patient and family and coordination of care Antony Contras, MD Note: This document was prepared with digital dictation and possible smart phrase technology. Any transcriptional errors that result from this process are unintentional

## 2022-01-22 NOTE — Telephone Encounter (Signed)
Rx refilled.

## 2022-01-22 NOTE — Patient Instructions (Signed)
I had a long discussion with the patient and her mother regarding her cerebral venous sinus thrombosis and she is doing well on anticoagulation for the last 10 months.  I reviewed the results of the last MR venogram done last month which shows persistent thrombosis of the right transverse sigmoid sinus and jugular vein with partial recanalization.  I recommend she discontinue Eliquis and switch to aspirin 81 mg daily.  We will repeat hypercoagulability panel labs after 1 week to be sure that she does not have any primary hypercoagulable disorder even though the initial lab work at the time of her admission was negative.  I encouraged her to keep herself well-hydrated and drink at least 8 to 10 glasses of liquids a day.  She was also counseled not to gain weight and to avoid birth control pills and smoking cigarettes and voiced understanding.  She will return for follow-up in the future in 6 months or call earlier if necessary.

## 2022-02-27 ENCOUNTER — Other Ambulatory Visit: Payer: Self-pay

## 2022-02-27 ENCOUNTER — Inpatient Hospital Stay: Payer: Managed Care, Other (non HMO) | Attending: Hematology

## 2022-02-27 DIAGNOSIS — G08 Intracranial and intraspinal phlebitis and thrombophlebitis: Secondary | ICD-10-CM | POA: Diagnosis present

## 2022-02-27 DIAGNOSIS — D500 Iron deficiency anemia secondary to blood loss (chronic): Secondary | ICD-10-CM

## 2022-02-27 DIAGNOSIS — E538 Deficiency of other specified B group vitamins: Secondary | ICD-10-CM

## 2022-02-27 DIAGNOSIS — D509 Iron deficiency anemia, unspecified: Secondary | ICD-10-CM | POA: Diagnosis present

## 2022-02-27 LAB — CBC WITH DIFFERENTIAL/PLATELET
Abs Immature Granulocytes: 0.02 10*3/uL (ref 0.00–0.07)
Basophils Absolute: 0.1 10*3/uL (ref 0.0–0.1)
Basophils Relative: 1 %
Eosinophils Absolute: 0.1 10*3/uL (ref 0.0–0.5)
Eosinophils Relative: 1 %
HCT: 46.5 % — ABNORMAL HIGH (ref 36.0–46.0)
Hemoglobin: 16.3 g/dL — ABNORMAL HIGH (ref 12.0–15.0)
Immature Granulocytes: 0 %
Lymphocytes Relative: 40 %
Lymphs Abs: 3.2 10*3/uL (ref 0.7–4.0)
MCH: 31.2 pg (ref 26.0–34.0)
MCHC: 35.1 g/dL (ref 30.0–36.0)
MCV: 88.9 fL (ref 80.0–100.0)
Monocytes Absolute: 0.6 10*3/uL (ref 0.1–1.0)
Monocytes Relative: 8 %
Neutro Abs: 4 10*3/uL (ref 1.7–7.7)
Neutrophils Relative %: 50 %
Platelets: 294 10*3/uL (ref 150–400)
RBC: 5.23 MIL/uL — ABNORMAL HIGH (ref 3.87–5.11)
RDW: 12.4 % (ref 11.5–15.5)
WBC: 7.9 10*3/uL (ref 4.0–10.5)
nRBC: 0 % (ref 0.0–0.2)

## 2022-02-27 LAB — IRON AND IRON BINDING CAPACITY (CC-WL,HP ONLY)
Iron: 29 ug/dL (ref 28–170)
Saturation Ratios: 8 % — ABNORMAL LOW (ref 10.4–31.8)
TIBC: 386 ug/dL (ref 250–450)
UIBC: 357 ug/dL (ref 148–442)

## 2022-02-27 LAB — VITAMIN B12: Vitamin B-12: 783 pg/mL (ref 180–914)

## 2022-02-27 LAB — FERRITIN: Ferritin: 21 ng/mL (ref 11–307)

## 2022-04-16 ENCOUNTER — Other Ambulatory Visit (INDEPENDENT_AMBULATORY_CARE_PROVIDER_SITE_OTHER): Payer: Self-pay

## 2022-04-16 DIAGNOSIS — G08 Intracranial and intraspinal phlebitis and thrombophlebitis: Secondary | ICD-10-CM

## 2022-04-16 DIAGNOSIS — Z0289 Encounter for other administrative examinations: Secondary | ICD-10-CM

## 2022-04-20 ENCOUNTER — Encounter: Payer: Self-pay | Admitting: Hematology

## 2022-04-20 ENCOUNTER — Encounter: Payer: Self-pay | Admitting: Neurology

## 2022-04-22 ENCOUNTER — Emergency Department
Admission: EM | Admit: 2022-04-22 | Discharge: 2022-04-22 | Disposition: A | Discharge status: Home / Self Care | Payer: Managed Care, Other (non HMO) | Attending: Emergency Medicine | Admitting: Emergency Medicine

## 2022-04-22 ENCOUNTER — Telehealth: Payer: Self-pay

## 2022-04-22 ENCOUNTER — Other Ambulatory Visit: Payer: Self-pay

## 2022-04-22 DIAGNOSIS — E860 Dehydration: Secondary | ICD-10-CM | POA: Insufficient documentation

## 2022-04-22 DIAGNOSIS — N390 Urinary tract infection, site not specified: Secondary | ICD-10-CM | POA: Diagnosis not present

## 2022-04-22 DIAGNOSIS — R55 Syncope and collapse: Secondary | ICD-10-CM | POA: Diagnosis present

## 2022-04-22 LAB — URINALYSIS, ROUTINE W REFLEX MICROSCOPIC
Bilirubin Urine: NEGATIVE
Glucose, UA: NEGATIVE mg/dL
Ketones, ur: NEGATIVE mg/dL
Nitrite: NEGATIVE
Protein, ur: 30 mg/dL — AB
Specific Gravity, Urine: 1.021 (ref 1.005–1.030)
WBC, UA: >50 WBC/hpf — ABNORMAL HIGH (ref 0–5)
pH: 5 (ref 5.0–8.0)

## 2022-04-22 LAB — CBC
HCT: 50.6 % — ABNORMAL HIGH (ref 36.0–46.0)
Hemoglobin: 16.2 g/dL — ABNORMAL HIGH (ref 12.0–15.0)
MCH: 29.7 pg (ref 26.0–34.0)
MCHC: 32 g/dL (ref 30.0–36.0)
MCV: 92.8 fL (ref 80.0–100.0)
Platelets: 245 10*3/uL (ref 150–400)
RBC: 5.45 MIL/uL — ABNORMAL HIGH (ref 3.87–5.11)
RDW: 13.4 % (ref 11.5–15.5)
WBC: 9 10*3/uL (ref 4.0–10.5)
nRBC: 0 % (ref 0.0–0.2)

## 2022-04-22 LAB — BASIC METABOLIC PANEL
Anion gap: 9 (ref 5–15)
BUN: 8 mg/dL (ref 6–20)
CO2: 29 mmol/L (ref 22–32)
Calcium: 10 mg/dL (ref 8.9–10.3)
Chloride: 100 mmol/L (ref 98–111)
Creatinine, Ser: 0.93 mg/dL (ref 0.44–1.00)
GFR, Estimated: >60 mL/min (ref >60)
Glucose, Bld: 130 mg/dL — ABNORMAL HIGH (ref 70–99)
Potassium: 4.1 mmol/L (ref 3.5–5.1)
Sodium: 138 mmol/L (ref 135–145)

## 2022-04-22 LAB — POC URINE PREG, ED: Preg Test, Ur: NEGATIVE

## 2022-04-22 MED ORDER — CEFDINIR 300 MG PO CAPS: 300.0000 mg | ORAL_CAPSULE | Freq: Two times a day (BID) | ORAL | 0 refills | Status: AC

## 2022-04-22 MED ORDER — CEFDINIR 300 MG PO CAPS
300.0000 mg | ORAL_CAPSULE | Freq: Once | ORAL | Status: AC
  Administered 2022-04-22: 300 mg via ORAL
  Filled 2022-04-22: qty 1

## 2022-04-22 NOTE — ED Triage Notes (Signed)
Pt via POV from home. Pt c/o near syncopal episode for the past couple of days. States that her "vision went black" during these episodes but no LOC. 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 Pt found a tampon yesterday even though her period has been off for a week. Pt states she take baby ASA daily for a blood clot in her braine.

## 2022-04-22 NOTE — ED Provider Notes (Signed)
[facility] Provider Note   Event Date/Time   First MD Initiated Contact with Patient 04/22/22 1831     (approximate) History  Near Syncope  HPI Tammy Weber is a 21 y.o. female with no stated past surgical history who presents for presyncopal symptoms over the past 24 hours as well as vaginal discomfort and dysuria.  Patient is concerned that she may have "toxic shock syndrome" as she found a tampon in after not being on her period for the last week.  Patient states that she has not fully lost consciousness however she feels that when she stands up from a seated position her "vision goes black" and it does not improve for approximately a minute or until she sits down. ROS: Patient currently denies any vision changes, tinnitus, difficulty speaking, facial droop, sore throat, chest pain, shortness of breath, nausea/vomiting/diarrhea, or weakness/numbness/paresthesias in any extremity   Physical Exam  Triage Vital Signs: ED Triage Vitals  Enc Vitals Group     BP 04/22/22 1734 (!) 145/104     Pulse Rate 04/22/22 1734 (!) 102     Resp 04/22/22 1734 20     Temp 04/22/22 1734 98.5 F (36.9 C)     Temp Source 04/22/22 1734 Oral     SpO2 04/22/22 1734 99 %     Weight 04/22/22 1733 158 lb (71.7 kg)     Height 04/22/22 1733 5\' 8"  (1.727 m)     Head Circumference --      Peak Flow --      Pain Score 04/22/22 1733 0     Pain Loc --      Pain Edu? --      Excl. in [place]? --    Most recent vital signs: Vitals:   04/22/22 1734  BP: (!) 145/104  Pulse: (!) 102  Resp: 20  Temp: 98.5 F (36.9 C)  SpO2: 99%   General: Awake, oriented x4. CV:  Good peripheral perfusion.  Resp:  Normal effort.  Abd:  No distention.  Other:  Young adult female sitting in bed in no acute distress ED Results / Procedures / Treatments  Labs (all labs ordered are listed, but only abnormal results are displayed) Labs Reviewed  BASIC METABOLIC PANEL - Abnormal; Notable for the  following components:      Result Value   Glucose, Bld 130 (*)    All other components within normal limits  CBC - Abnormal; Notable for the following components:   RBC 5.45 (*)    Hemoglobin 16.2 (*)    HCT 50.6 (*)    All other components within normal limits  URINALYSIS, ROUTINE W REFLEX MICROSCOPIC - Abnormal; Notable for the following components:   Color, Urine YELLOW (*)    APPearance CLOUDY (*)    Hgb urine dipstick SMALL (*)    Protein, ur 30 (*)    Leukocytes,Ua LARGE (*)    WBC, UA >50 (*)    Bacteria, UA RARE (*)    All other components within normal limits  POC URINE PREG, ED  CBG MONITORING, ED   PROCEDURES: Critical Care performed: No Procedures MEDICATIONS ORDERED IN ED: Medications  cefdinir (OMNICEF) capsule 300 mg (has no administration in time range)   IMPRESSION / MDM / ASSESSMENT AND PLAN / ED COURSE  I reviewed the triage vital signs and the nursing notes.  Patient's presentation is most consistent with acute presentation with potential threat to life or bodily function. Patient presents with complaints of syncope/presyncope Differential diagnosis includes HF, ICH, seizure, stroke, HOCM, ACS, aortic dissection, malignant arrhythmia, or GI bleed. Not Pregnant. Unlikely TOA, Ovarian Torsion, PID, gonorrhea/chlamydia. Low suspicion for Infected Urolithiasis, AAA, Cholecystitis, Pancreatitis, SBO, Appendicitis, or other acute abdomen. Laboratory evaluation seen for UA showing large leukocytes with rare bacteria concerning for urinary tract infection given patient's symptoms.  CBC also looks relatively contracted and possibly due to dehydration. Rx: Cefdinir 300 mg BID for 5 days, instructions for rehydration Disposition: Discharge home. SRP discussed. Advise follow up with primary care provider within 24-72 hours.   FINAL CLINICAL IMPRESSION(S) / ED DIAGNOSES   Final diagnoses:  Near syncope  Dehydration  Urinary tract  infection with hematuria, site unspecified   Rx / DC Orders   ED Discharge Orders          Ordered    cefdinir (OMNICEF) 300 MG capsule  2 times daily        04/22/22 1845           Note:  This document was prepared using Dragon voice recognition software and may include unintentional dictation errors.   Merwyn Katos, MD 04/22/22 [phone]

## 2022-04-22 NOTE — Telephone Encounter (Signed)
Called patient and got her voicemail box. This LPN left message stating Dr. Burr Medico is wanting her to be seen by her or APP this week for her complaint of fainting spells and to expect a call from scheduling.

## 2022-04-23 ENCOUNTER — Encounter: Payer: Self-pay | Admitting: Nurse Practitioner

## 2022-04-24 ENCOUNTER — Inpatient Hospital Stay: Payer: Managed Care, Other (non HMO) | Admitting: Nurse Practitioner

## 2022-04-24 ENCOUNTER — Encounter: Payer: Self-pay | Admitting: Nurse Practitioner

## 2022-04-24 ENCOUNTER — Telehealth: Payer: Self-pay

## 2022-04-24 ENCOUNTER — Inpatient Hospital Stay: Payer: Managed Care, Other (non HMO) | Attending: Hematology

## 2022-04-24 ENCOUNTER — Other Ambulatory Visit: Payer: Self-pay

## 2022-04-24 VITALS — BP 139/70 | HR 75 | Temp 98.3°F | Ht 68.0 in | Wt 161.1 lb

## 2022-04-24 DIAGNOSIS — G08 Intracranial and intraspinal phlebitis and thrombophlebitis: Secondary | ICD-10-CM

## 2022-04-24 DIAGNOSIS — D751 Secondary polycythemia: Secondary | ICD-10-CM | POA: Insufficient documentation

## 2022-04-24 DIAGNOSIS — Z8639 Personal history of other endocrine, nutritional and metabolic disease: Secondary | ICD-10-CM | POA: Diagnosis not present

## 2022-04-24 DIAGNOSIS — Z7982 Long term (current) use of aspirin: Secondary | ICD-10-CM | POA: Insufficient documentation

## 2022-04-24 DIAGNOSIS — D500 Iron deficiency anemia secondary to blood loss (chronic): Secondary | ICD-10-CM

## 2022-04-24 DIAGNOSIS — R55 Syncope and collapse: Secondary | ICD-10-CM

## 2022-04-24 LAB — CBC WITH DIFFERENTIAL/PLATELET
Abs Immature Granulocytes: 0.02 10*3/uL (ref 0.00–0.07)
Basophils Absolute: 0 10*3/uL (ref 0.0–0.1)
Basophils Relative: 1 %
Eosinophils Absolute: 0.2 10*3/uL (ref 0.0–0.5)
Eosinophils Relative: 3 %
HCT: 46.2 % — ABNORMAL HIGH (ref 36.0–46.0)
Hemoglobin: 16.2 g/dL — ABNORMAL HIGH (ref 12.0–15.0)
Immature Granulocytes: 0 %
Lymphocytes Relative: 31 %
Lymphs Abs: 1.9 10*3/uL (ref 0.7–4.0)
MCH: 31.8 pg (ref 26.0–34.0)
MCHC: 35.1 g/dL (ref 30.0–36.0)
MCV: 90.8 fL (ref 80.0–100.0)
Monocytes Absolute: 0.5 10*3/uL (ref 0.1–1.0)
Monocytes Relative: 9 %
Neutro Abs: 3.6 10*3/uL (ref 1.7–7.7)
Neutrophils Relative %: 56 %
Platelets: 181 10*3/uL (ref 150–400)
RBC: 5.09 MIL/uL (ref 3.87–5.11)
RDW: 12.8 % (ref 11.5–15.5)
WBC: 6.3 10*3/uL (ref 4.0–10.5)
nRBC: 0 % (ref 0.0–0.2)

## 2022-04-24 LAB — IRON AND IRON BINDING CAPACITY (CC-WL,HP ONLY)
Iron: 156 ug/dL (ref 28–170)
Saturation Ratios: 34 % — ABNORMAL HIGH (ref 10.4–31.8)
TIBC: 459 ug/dL — ABNORMAL HIGH (ref 250–450)
UIBC: 303 ug/dL (ref 148–442)

## 2022-04-24 LAB — FERRITIN: Ferritin: 31 ng/mL (ref 11–307)

## 2022-04-24 NOTE — Telephone Encounter (Signed)
Per Natale Milch NP, MR MRV Head ordered stat. Patient advised [name] that she would like a morning appointment - writer clarified this with [name] that 04/25/22 would be appropriate.   Authorized to be done at GI on 04/25/22 @ 10:30 AM with arrival time at 10 AM. Instructed patient to bring picture ID and insurance card and to wear comfortable clothing with no metal.  Called and Left VM. Will send my -chart message with appointment details.

## 2022-04-24 NOTE — Progress Notes (Addendum)
[place]   [phone] [phone]   Clinic Follow up Note   Patient Care Team: Pcp, No as PCP - General 04/24/2022  CHIEF COMPLAINT: Follow-up presyncope in the setting of cerebral venous sinus thrombosis  CURRENT THERAPY: Anticoagulation with Eliquis 03/20/2021 - 01/17/2022 then changed to aspirin 81 mg  INTERVAL HISTORY: Tammy Weber presents for symptom management visit.  Last seen by Dr. Burr Medico 11/2021 and continued on Eliquis.  She saw neurology 12/2021 who recommended to transition to baby aspirin.  She has been doing well in the interim until 2 weeks ago when she developed bilateral hand numbness with slightly bending her arms and holding that position for a few minutes.  Denies other paresthesias.  She then had a menstrual period that lasted 2 weeks ending 04/13/22. On 04/20/22 she started having multiple near syncopal episodes where her vision goes black and blurry and she almost passes out.  The room "tilts" but does not spin.  She does not get an alert from her Apple Watch that her heart rate is abnormal.  BP at home has been normal.  She is hydrating and drinking propel water.  Denies headache.  She was concerned she had toxic shock syndrome from tampon.  She went to ED 04/22/2022 and that was ruled out, however she was diagnosed with a UTI.  She denies urinary symptoms.  She continues to abstain from smoking and is not on OCP.  She stopped taking iron about 2 months ago and is now only doing iron rich diet.  No history of asthma or other chronic respiratory condition.  All other systems were reviewed with the patient and are negative.  MEDICAL HISTORY:  Past Medical History:  Diagnosis Date   Chronic cerebral venous infarction    Vaccine for human papilloma virus (HPV) types 6, 11, 16, and 18 administered     SURGICAL HISTORY: Past Surgical History:  Procedure Laterality Date   NO PAST SURGERIES      I have reviewed the social history and  family history with the patient and they are unchanged from previous note.  ALLERGIES:  is allergic to latex.  MEDICATIONS:  Current Outpatient Medications  Medication Sig Dispense Refill   aspirin EC 81 MG tablet Take 1 tablet (81 mg total) by mouth daily. Swallow whole. 30 tablet 12   cefdinir (OMNICEF) 300 MG capsule Take 1 capsule (300 mg total) by mouth 2 (two) times daily for 5 days. 10 capsule 0   No current facility-administered medications for this visit.    PHYSICAL EXAMINATION: ECOG PERFORMANCE STATUS: 1 - Symptomatic but completely ambulatory  Vitals:   04/24/22 1035 04/24/22 1036  BP: 136/84 139/70  Pulse: 73 75  Temp:    SpO2:     Filed Weights   04/24/22 1033  Weight: 161 lb 1.6 oz (73.1 kg)    GENERAL:alert, no distress and comfortable SKIN: No rash EYES:  sclera clear OROPHARYNX:no exudate, no erythema and lips, buccal mucosa, and tongue normal  NECK: Without mass LUNGS: clear with normal breathing effort HEART: regular rate & rhythm, no lower extremity edema ABDOMEN:abdomen soft, non-tender and normal bowel sounds Musculoskeletal: Normal strength and tone NEURO: alert & oriented x 3 with fluent speech, no focal motor/sensory deficits  LABORATORY DATA:  I have reviewed the data as listed    Latest Ref Rng & Units 04/24/2022   10:04 AM 04/22/2022    5:38 PM 02/27/2022    9:50 AM  CBC  WBC 4.0 -  10.5 K/uL 6.3  9.0  7.9   Hemoglobin 12.0 - 15.0 g/dL 18.5  63.1  49.7   Hematocrit 36.0 - 46.0 % 46.2  50.6  46.5   Platelets 150 - 400 K/uL 181  245  294         Latest Ref Rng & Units 04/22/2022    5:38 PM 03/30/2021    4:14 AM 03/29/2021    5:24 PM  CMP  Glucose 70 - 99 mg/dL 026  94  99   BUN 6 - 20 mg/dL 8  5  7    Creatinine 0.44 - 1.00 mg/dL  3.78  5.88   Sodium 135 - 145 mmol/L 138  137  138   Potassium 3.5 - 5.1 mmol/L 4.1  4.1  4.1   Chloride 98 - 111 mmol/L 100  108  104   CO2 22 - 32 mmol/L 29  21  24    Calcium 8.9 - 10.3 mg/dL  5.02  8.9  9.5   Total Protein 6.5 - 8.1 g/dL   7.5   Total Bilirubin 0.3 - 1.2 mg/dL   0.2   Alkaline Phos 38 - 126 U/L   58   AST 15 - 41 U/L   16   ALT 0 - 44 U/L   12       RADIOGRAPHIC STUDIES: I have personally reviewed the radiological images as listed and agreed with the findings in the report. No results found.   ASSESSMENT & PLAN: Tammy Weber is a 21 y.o. female with    Near syncope  -Onset 04/20/22 after 2-week long menstrual period -VSS, no orthostatic hypotension, she appears hydrated  -EKG shows normal sinus rhythm in clinic today -she has positional paresthesia in her hands, but today's neuro exam is nonfocal  -this is likely not cardiac.  -Given her h/o cerebral sinus thrombosis will need to r/o recurrent/worsening clot.  -she is being referred for stat MR MRV head wo contrast.  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 CT head venogram confirmed right transverse and sigmoid sinus thrombosis. She was discharged with 5 days of Lovenox then oral Pradaxa. Birth control pills were discontinued at that time. -returned 03/29/21 with worsening headache with muffled hearing. Brain MRI showed mild progression of CVST. -she received heparin infusion while in the hospital. She was discharged on Eliquis with loading dose. She is tolerating well  -she had negative factor V Leiden mutation, prothrombin gene mutation, beta-2 glycoprotein and cardiolipin antibodies -repeat brain MRI/MRV on Jul 08, 2021 showed improved flow signal. Repeat on 12/23/21 showed no change from 07/2021, with chronic thrombosis.  -Dr. 12/25/21 stopped Eliquis and changed her to baby aspirin 81 mg at the end of 12/2021. Recently repeated hypercoagulable panel 10/17, results are pending  -She understands the importance of avoiding smoking and OCP. She is eating healthy and active.  -doing well  until new pre-syncopal episodes. Scan tomorrow   -F/up pending results   3. Polycythemia  -She had mild IDA in the past, then normalized, and she developed mild erythrocytosis/polycythemia with hgb 15.4 in 10/05/21. This has also been mild and intermittent, highest hgb 16.3 in 8/23 -She went to ED recently for pre-syncope, hgb/hct 16.2/50.6; she was felt to be dehydrated and treated for UTI. She states she is well hydrated.  -today's H/H 16.2/46.2. this is likely reactive, given the mild intermittent nature -Once her  pre-syncopal work up is complete and these resolve, she can donate blood occasionally to control H/H -We encouraged her to hydrate. Will continue monitoring   4. Anemia, iron deficiency and B12 deficiency -she received two doses of ferric gluconate and one B12 while in the hospital, then two doses of B12 and three doses of IV Venofer after discharge. -she notes increasing periods since starting Eliquis, requiring her to change pad every hour, as well as gum bleeding while brushing her teeth. this was managed by holding eliquis the first few days of her period. -She switched to ASA 81 mg in late 12/2021, LMP in 10/23 lasted 2 weeks -Given her elevated hgb lately I recommend to hold iron for now. Ferritin is 31 today -Consider IV iron if her ferritin is <10.   PLAN: -ED course and today's labs reviewed -STAT MR venogram of the brain to evaluate pre-syncopal events  -F/up pending with results, will cc note to Neuro -Pt seen with Dr. Mosetta Putt    Orders Placed This Encounter  Procedures   MR MRV HEAD WO CM    [employer]  Epic ORDER   **Aware of CX fee** WT: 161lbs / HT: 5'8"/ Special needs: NONE/ Claus: NO/ Metal in eyes or removed: NO/ NO BB's, bullets, pacemaker, defibrillator, stents, body injector, stimulator, aneurysm clips/ NO glucose monitor, pain patches attached to skin NO hx of sx to the brain, heart, eyes or ears/  REP [facility] 04-24-22 PT AND SE 04-24-22      Standing Status:   Future    Standing Expiration Date:   04/25/2023    Order Specific Question:   What is the patient's sedation requirement?    Answer:   No Sedation    Order Specific Question:   Does the patient have a pacemaker or implanted devices?    Answer:   No    Order Specific Question:   Preferred imaging location?    Answer:   [facility] (table limit - 550 lbs)    Order Specific Question:   Release to patient    Answer:   Immediate    Order Specific Question:   Call Results- Best Contact Number?    Answer:   [phone]   EKG 12-Lead   EKG 12-Lead    Ordered by an unspecified provider    All questions were answered. The patient knows to call the clinic with any problems, questions or concerns. No barriers to learning were detected.     Pollyann Samples, NP 04/24/22    Addendum I have seen the patient, examined her. I agree with the assessment and and plan and have edited the notes.   She developed recurrent presyncope episodes in the past 3 to 4 days, she has a iWatch and her heart rate has been normal during those episodes.  No vertigo.  Plan to repeat a brain MRI, if negative, I recommend her to follow-up with her neurologist. EKG today reviewed, unremarkable.  Lab reviewed, hemoglobin slightly elevated, she is not a smoker, not on birth control pill, will monitor for now, I encouraged her to drink more fluids.  She will continue baby aspirin.  Malachy Mood  04/24/2022

## 2022-04-25 ENCOUNTER — Other Ambulatory Visit: Payer: Self-pay

## 2022-04-25 ENCOUNTER — Ambulatory Visit
Admission: RE | Admit: 2022-04-25 | Discharge: 2022-04-25 | Disposition: A | Discharge status: Home / Self Care | Payer: Managed Care, Other (non HMO) | Source: Ambulatory Visit | Attending: Nurse Practitioner | Admitting: Nurse Practitioner

## 2022-04-25 DIAGNOSIS — R55 Syncope and collapse: Secondary | ICD-10-CM

## 2022-04-25 DIAGNOSIS — G08 Intracranial and intraspinal phlebitis and thrombophlebitis: Secondary | ICD-10-CM

## 2022-04-26 ENCOUNTER — Encounter: Payer: Self-pay | Admitting: Nurse Practitioner

## 2022-04-26 ENCOUNTER — Other Ambulatory Visit: Payer: Self-pay

## 2022-04-26 DIAGNOSIS — D500 Iron deficiency anemia secondary to blood loss (chronic): Secondary | ICD-10-CM

## 2022-04-29 ENCOUNTER — Other Ambulatory Visit: Payer: Managed Care, Other (non HMO)

## 2022-05-01 LAB — HYPERCOAGULABLE PANEL, COMPREHENSIVE
APTT: 25.9 s
AT III Act/Nor PPP Chro: 115 %
Act. Prt C Resist w/FV Defic.: 2.4 ratio
Anticardiolipin Ab, IgG: <10 [GPL'U]
Anticardiolipin Ab, IgM: <10 [MPL'U]
Beta-2 Glycoprotein I, IgA: <10 SAU
Beta-2 Glycoprotein I, IgG: <10 SGU
Beta-2 Glycoprotein I, IgM: <10 SMU
DRVVT Screen Seconds: 35.2 s
Factor VII Antigen**: 105 %
Factor VIII Activity: 116 %
Hexagonal Phospholipid Neutral: 8 s
Homocysteine: 19.5 umol/L — ABNORMAL HIGH
Prot C Ag Act/Nor PPP Imm: 129 %
Prot S Ag Act/Nor PPP Imm: 84 %
Protein C Ag/FVII Ag Ratio**: 1.2 ratio
Protein S Ag/FVII Ag Ratio**: 0.8 ratio

## 2022-05-05 ENCOUNTER — Other Ambulatory Visit: Payer: Self-pay | Admitting: Neurology

## 2022-05-05 MED ORDER — FOLIC ACID 1 MG PO TABS: 1.0000 mg | ORAL_TABLET | Freq: Every day | ORAL | 3 refills | Status: AC

## 2022-05-05 NOTE — Progress Notes (Signed)
Kindly inform the patient that lab work for abnormal clotting is mostly normal except elevated homocystine levels.  I recommend stop smoking cigarettes in case she smokes and to start taking IT 1 mg tablet daily which I prescribed

## 2022-05-06 ENCOUNTER — Telehealth: Payer: Self-pay | Admitting: Neurology

## 2022-05-06 NOTE — Telephone Encounter (Signed)
Garvin Fila, MD  P Gna-Pod 2 Results Kindly inform the patient that lab work for abnormal clotting is mostly normal except elevated homocystine levels.  I recommend stop smoking cigarettes in case she smokes and to start taking IT 1 mg tablet daily which I prescribed  I relayed the following message to the patient. She verbalized understanding of the findings and expressed appreciation for the call. All questions answered.

## 2022-05-06 NOTE — Telephone Encounter (Signed)
Pt states she missed a call about 10 mins ago.  Pt is asking for a call with results

## 2022-06-06 ENCOUNTER — Other Ambulatory Visit: Payer: Self-pay

## 2022-06-06 ENCOUNTER — Inpatient Hospital Stay: Payer: Managed Care, Other (non HMO) | Attending: Hematology

## 2022-06-06 DIAGNOSIS — G08 Intracranial and intraspinal phlebitis and thrombophlebitis: Secondary | ICD-10-CM | POA: Diagnosis present

## 2022-06-06 DIAGNOSIS — D500 Iron deficiency anemia secondary to blood loss (chronic): Secondary | ICD-10-CM

## 2022-06-06 LAB — CBC WITH DIFFERENTIAL (CANCER CENTER ONLY)
Abs Immature Granulocytes: 0.02 10*3/uL (ref 0.00–0.07)
Basophils Absolute: 0.1 10*3/uL (ref 0.0–0.1)
Basophils Relative: 1 %
Eosinophils Absolute: 0.1 10*3/uL (ref 0.0–0.5)
Eosinophils Relative: 2 %
HCT: 44 % (ref 36.0–46.0)
Hemoglobin: 14.7 g/dL (ref 12.0–15.0)
Immature Granulocytes: 0 %
Lymphocytes Relative: 24 %
Lymphs Abs: 1.1 10*3/uL (ref 0.7–4.0)
MCH: 31.3 pg (ref 26.0–34.0)
MCHC: 33.4 g/dL (ref 30.0–36.0)
MCV: 93.6 fL (ref 80.0–100.0)
Monocytes Absolute: 0.8 10*3/uL (ref 0.1–1.0)
Monocytes Relative: 18 %
Neutro Abs: 2.5 10*3/uL (ref 1.7–7.7)
Neutrophils Relative %: 55 %
Platelet Count: 212 10*3/uL (ref 150–400)
RBC: 4.7 MIL/uL (ref 3.87–5.11)
RDW: 12.4 % (ref 11.5–15.5)
WBC Count: 4.6 10*3/uL (ref 4.0–10.5)
nRBC: 0 % (ref 0.0–0.2)

## 2022-06-06 LAB — IRON AND IRON BINDING CAPACITY (CC-WL,HP ONLY)
Iron: 35 ug/dL (ref 28–170)
Saturation Ratios: 8 % — ABNORMAL LOW (ref 10.4–31.8)
TIBC: 435 ug/dL (ref 250–450)
UIBC: 400 ug/dL (ref 148–442)

## 2022-06-06 LAB — FERRITIN: Ferritin: 30 ng/mL (ref 11–307)

## 2022-06-11 ENCOUNTER — Encounter: Payer: Self-pay | Admitting: Hematology

## 2022-06-28 ENCOUNTER — Other Ambulatory Visit: Payer: Managed Care, Other (non HMO)

## 2022-06-28 ENCOUNTER — Ambulatory Visit: Payer: Managed Care, Other (non HMO) | Admitting: Hematology

## 2022-07-08 ENCOUNTER — Encounter: Payer: Self-pay | Admitting: Neurology

## 2022-07-08 ENCOUNTER — Ambulatory Visit: Payer: Managed Care, Other (non HMO) | Admitting: Neurology

## 2022-07-08 VITALS — BP 121/70 | HR 71 | Ht 68.0 in | Wt 167.5 lb

## 2022-07-08 DIAGNOSIS — R7983 Abnormal findings of blood amino-acid level: Secondary | ICD-10-CM | POA: Diagnosis not present

## 2022-07-08 DIAGNOSIS — G08 Intracranial and intraspinal phlebitis and thrombophlebitis: Secondary | ICD-10-CM

## 2022-07-08 NOTE — Progress Notes (Signed)
[facility] [address] [place]. [place] [address] [phone]       OFFICE FOLLOW-UP NOTE  [name] Date of Birth:  2001-06-05 Medical Record Number:  [id number]   HPI: Initial visit 04/02/2021 Tammy Weber is a pleasant 22 year old Caucasian lady seen today for initial office follow-up following hospital consultation for cerebral venous sinus thrombosis.  She is accompanied by her mother.  History is obtained from them and review of electronic medical records and I personally reviewed pertinent available imaging films in PACS.  She is a 22 year old female with no significant past medical history who initially presented on 03/29/2021 with worsening headache and muffled hearing.  She described right-sided throbbing headache mostly in the temples and the back of the head for the past 3 weeks associated with some sound sensitivity.  She was seen in the ER on 03/27/2021 and CT scan and CT venogram showed right sigmoid and transverse sinus thrombosis.  Since she had no focal deficits or she was started on low molecular weight subcu heparin 100 mg every 24 hours for 5 days along with Pradaxa 150 mg twice daily and discharged home.  However she returned 2 days later to the ER with worsening headache and pressure in head and muffled hearing like she is underwater on the right.  MRI scan of the brain showed no acute infarct and MR venogram showed occlusive thrombus in the right transverse and sigmoid sinus extending into the jugular bulb and upper portion of jugular vein.  Comparison between the previous CT venogram and the MR venogram was not entirely accurate due to technical differences but the thrombus was felt to have progressed a bit.  Patient was admitted this time and started on IV heparin drip and switched to Eliquis.  Possibility of doing mechanical thrombectomy to decrease clot burden were discussed with the patient and her father with the patient shows not to have  mechanical thrombectomy.  Patient was advised to quit her birth control pills and see her gynecologist to discuss alternative treatment options for her heavy menstruation.  She was referred to hematologist Dr. Annamaria Boots but that appointment has not yet been made.  She had low vitamin B12 level of 177 and was started on 1000 mcg of vitamin B12 and daily.  She also had iron deficiency anemia for which she was given Ferrlecit 50 mg IV 2 doses and discharged on ferrous gluconate 1 tablet daily.  Patient states she is done well since discharge.  She is tolerating Eliquis well without bruising or bleeding.  She feels there is improvement in the pressure in the right ear as well as headaches which have now practically gone.  She still has subjective decreased hearing and has trouble when there are multiple sounds occurring.  She has gone back to college and has no problems with her classes.  She wants to drive again.  She denies any prior history of deep vein thrombosis, pulmonary embolism, sickle cell disease.  There is no family history of blood clots at a young age.  She had no history of smoking cigarettes but does vape occasionally.  She had a Pfizer vaccine on 10/11/2019 but has not had any recent COVID infection Update 07/25/2021 : She returns for follow-up after last visit 3 months ago.  She is accompanied by her mother.  She is doing well.  She denies significant head pressure or decreased hearing today.  She is tolerating Eliquis well with only minor bruising and bleeding if she is  scratches herself.  She is scheduled to undergo iron infusion next week.  She has not yet met with the gynecologist to discuss alternative medication options for her heavy menstrual bleeding.  She denies any other new health problems.  She did undergo a follow-up MR venogram on 07/04/2021 which shows improved but yet diminished flow in the right transverse, sigmoid sinus and jugular vein.  MRI scan of the brain shows no significant brain  abnormalities except decreased flow signal in the right transverse and sigmoid sinuses.  She has no complaints today. Update 01/22/2022 : She returns for follow-up after last visit 6 months ago.  She is accompanied by her mother.  She continues to do well and has not had any neurological symptoms.  She denies significant headaches and in fact has stopped taking the Topamax.  She remains on Eliquis which is tolerating well without bruising or bleeding.  Menstrual cycles have not been particularly heavy with the bleeding.  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  She had follow-up MR venogram of the brain on 12/23/2021 which showed persistent diminished flow in the right transverse, sigmoid sinus and internal jugular vein with partial recanalization and overall no significant change compared with previous MR venogram dated July 05, 2021.  She did follow-up with Dr. Annamaria Boots hematologist a few months ago who also recommended discontinuing Eliquis after 9 to 10 months and then checking hypercoagulable panel labs later.  Patient has no complaints today. Update 07/08/2022 : She returns for follow-up after last visit 6 months ago.  She is accompanied by mother.  Patient continues to do well and has had no recurrent stroke or TIA symptoms or aches.  She has discontinued Eliquis and is now only on aspirin 81 mg daily which is tolerating well without GI side effects or bruising or bleeding.  She has noticed only slight improvement in her menstrual bleeding.  He had follow-up lab work on 04/16/2022 and hypercoagulable lab work was all negative except for elevated homocystine level of 19.5.  She does admit to smoking and knows she needs to quit .she has been started on folic acid 1 mg daily which is tolerating well.  He has no new complaints today. ROS:   14 system review of systems is positive for , headache, heavy menstrual bleeding all other systems negative  PMH:  Past Medical History:  Diagnosis Date    Chronic cerebral venous infarction    Vaccine for human papilloma virus (HPV) types 6, 11, 16, and 18 administered     Social History:  Social History   Socioeconomic History   Marital status: Single    Spouse name: Not on file   Number of children: Not on file   Years of education: Not on file   Highest education level: Not on file  Occupational History   Not on file  Tobacco Use   Smoking status: Never   Smokeless tobacco: Never  Vaping Use   Vaping Use: Never used  Substance and Sexual Activity   Alcohol use: Yes   Drug use: No   Sexual activity: Yes    Birth control/protection: None, Condom  Other Topics Concern   Not on file  Social History Narrative   Lives on campus at [employer] caffeine rarely   Social Determinants of Health   Financial Resource Strain: Not on file  Food Insecurity: Not on file  Transportation Needs: Not on file  Physical Activity: Not  on file  Stress: Not on file  Social Connections: Not on file  Intimate Partner Violence: Not on file    Medications:   Current Outpatient Medications on File Prior to Visit  Medication Sig Dispense Refill   aspirin EC 81 MG tablet Take 1 tablet (81 mg total) by mouth daily. Swallow whole. 30 tablet 12   folic acid (FOLVITE) 1 MG tablet Take 1 tablet (1 mg total) by mouth daily. 100 tablet 3   No current facility-administered medications on file prior to visit.    Allergies:   Allergies  Allergen Reactions   Latex Itching    Physical Exam General: well developed, well nourished young Caucasian lady, seated, in no evident distress Head: head normocephalic and atraumatic.  Neck: supple with no carotid or supraclavicular bruits Cardiovascular: regular rate and rhythm, no murmurs Musculoskeletal: no deformity Skin:  no rash/petichiae Vascular:  Normal pulses all extremities Vitals:   07/08/22 1258  BP: 121/70  Pulse: 71   Neurologic Exam Mental Status: Awake and fully alert.  Oriented to place and time. Recent and remote memory intact. Attention span, concentration and fund of knowledge appropriate. Mood and affect appropriate.  Cranial Nerves: Fundoscopic exam reveals sharp disc margins. Pupils equal, briskly reactive to light. Extraocular movements full without nystagmus. Visual fields full to confrontation. 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 Facial sensation intact. Face, tongue, palate moves normally and symmetrically.  Motor: Normal bulk and tone. Normal strength in all tested extremity muscles. Sensory.: intact to touch ,pinprick .position and vibratory sensation.  Coordination: Rapid alternating movements normal in all extremities. Finger-to-nose and heel-to-shin performed accurately bilaterally. Gait and Station: Arises from chair without difficulty. Stance is normal. Gait demonstrates normal stride length and balance . Able to heel, toe and tandem walk without difficulty.  Reflexes: 1+ and symmetric. Toes downgoing.       ASSESSMENT: 22 year old Caucasian lady with right transverse and sigmoid sinus and jugular vein thrombosis in September 2022 likely related to using birth control pills.  She is doing quite well  .     PLAN:II had a long discussion with the patient and her mother regarding her remote history of cerebral venous sinus thrombosis and mild elevated homocystine levels.  I recommend she continue aspirin 81 mg and folic acid 1 mg daily.  Repeat levels today.  I encouraged her to keep herself well-hydrated and to avoid birth control pills or estrogen in the future quit smoking as well.  No scheduled follow-up appointment is necessary but she may return in the future only as needed. Greater than 50% of time during this 35 minute visit was spent on counseling,explanation of diagnosis of cerebral venous sinus thrombosis, planning of further management, discussion with patient and family and coordination of  care Delia Heady, MD Note: This document was prepared with digital dictation and possible smart phrase technology. Any transcriptional errors that result from this process are unintentional

## 2022-07-08 NOTE — Patient Instructions (Signed)
I had a long discussion with the patient and her mother regarding her remote history of cerebral venous sinus thrombosis and mild elevated homocystine levels.  I recommend she continue aspirin 81 mg and folic acid 1 mg daily.  Repeat levels today.  I encouraged her to keep herself well-hydrated and to avoid birth control pills or estrogen in the future quit smoking as well.  No scheduled follow-up appointment is necessary but she may return in the future only as needed.  Homocysteine Test Why am I having this test? The homocysteine test may be done for several reasons. You may have this test if: You are found to have hardening of your coronary arteries but have no other known risk factors for cardiovascular disease. You have a strong family history of heart or blood vessel disease at a very young age. You have a deficiency of vitamins B6, B12, or folate. In people with these vitamin deficiencies, the homocysteine blood test may be done to help check for malnutrition. The test may also be done for an infant or child if there is concern that he or she was born with a condition that causes elevated levels of homocysteine. What is being tested? This test measures how much homocysteine is in your blood. Homocysteine is an amino acid that is made when certain proteins are broken down (metabolized). A high level of homocysteine may put you at risk for heart disease and blood vessel disease throughout your body. What kind of sample is taken?  A blood sample is required for this test. It is usually collected by inserting a needle into a blood vessel. How do I prepare for this test? Follow any instructions from your health care provider about how to prepare for the test. Tell a health care provider about: Any medical conditions you have. Any vitamins or supplements you are taking. Whether you smoke. How are the results reported? Your test results will be reported as a value that indicates how much  homocysteine is in your blood. This will be given as micromoles of homocysteine per liter of blood (micromoles/L). Your health care provider will compare your results to normal ranges that were established after testing a large group of people (reference ranges). Reference ranges may vary among labs and hospitals. For this test, a common reference range is: Normal: less than 12 micromoles/L. What do the results mean? A homocysteine level that is higher than 12 micromoles/L may indicate that you have an increased risk for cardiovascular disease, cerebrovascular disease, and peripheral vascular disease. This can increase your risk for: Heart attack. Stroke. Problems with blood flow to the feet and legs. This may cause ulcers or sores that take longer to heal. High levels of homocysteine may also indicate: Vitamin B6 or B12 deficiency. Folate deficiency. Malnutrition. Talk with your health care provider about what your results mean. Questions to ask your health care provider Ask your health care provider, or the department that is doing the test: When will my results be ready? How will I get my results? What are my treatment options? What other tests do I need? What are my next steps? Summary A homocysteine test may be done if you are found to have hardening arteries or have symptoms of coronary disease at a very young age. It may also be done to monitor for malnutrition if you have a folate, vitamin B6, or vitamin B12 deficiency. A high level of homocysteine may put you at increased risk for cardiovascular disease, cerebrovascular disease, and peripheral  vascular disease. Talk with your health care provider about what your results mean. This information is not intended to replace advice given to you by your health care provider. Make sure you discuss any questions you have with your health care provider. Document Revised: 09/20/2020 Document Reviewed: 09/20/2020 Elsevier Patient Education   2023 [employer].

## 2022-07-09 ENCOUNTER — Encounter: Payer: Self-pay | Admitting: Nurse Practitioner

## 2022-07-09 LAB — HOMOCYSTEINE: Homocysteine: 9.4 umol/L (ref 0.0–14.5)

## 2022-07-10 NOTE — Progress Notes (Signed)
Kindly inform the patient that repeat levels of homocystine satisfactory.  Continue current dose of folic acid.

## 2022-07-14 ENCOUNTER — Other Ambulatory Visit: Payer: Self-pay | Admitting: Nurse Practitioner

## 2022-07-15 ENCOUNTER — Other Ambulatory Visit: Payer: Self-pay

## 2022-07-15 DIAGNOSIS — T561X1A Toxic effect of mercury and its compounds, accidental (unintentional), initial encounter: Secondary | ICD-10-CM

## 2022-07-15 NOTE — Progress Notes (Unsigned)
Patient Care Team: Pcp, No as PCP - General   CHIEF COMPLAINT: Follow-up history of venous sinus thrombosis, folic acid deficiency  CURRENT THERAPY: Treated with Eliquis 03/2021-12/2021, now on baby aspirin, and oral folic acid  INTERVAL HISTORY as per returns for follow-up as scheduled, last seen by me 04/24/2022 for presyncopal episodes.  MRV was stable.  She continues baby aspirin.  She is also taking folic acid.  She saw neuro 07/08/2022 who made no changes to her current plan and will see her back as needed.  She requested for [place] to check [place] level given her high ingestion of seafood.  ROS   Past Medical History:  Diagnosis Date   Chronic cerebral venous infarction    Vaccine for human papilloma virus (HPV) types 6, 11, 16, and 18 administered      Past Surgical History:  Procedure Laterality Date   NO PAST SURGERIES       Outpatient Encounter Medications as of 07/16/2022  Medication Sig   aspirin EC 81 MG tablet Take 1 tablet (81 mg total) by mouth daily. Swallow whole.   folic acid (FOLVITE) 1 MG tablet Take 1 tablet (1 mg total) by mouth daily.   No facility-administered encounter medications on file as of 07/16/2022.     There were no vitals filed for this visit. There is no height or weight on file to calculate BMI.   PHYSICAL EXAM GENERAL:alert, no distress and comfortable SKIN: no rash  EYES: sclera clear NECK: without mass LYMPH:  no palpable cervical or supraclavicular lymphadenopathy  LUNGS: clear with normal breathing effort HEART: regular rate & rhythm, no lower extremity edema ABDOMEN: abdomen soft, non-tender and normal bowel sounds NEURO: alert & oriented x 3 with fluent speech, no focal motor/sensory deficits Breast exam:  PAC without erythema    CBC    Component Value Date/Time   WBC 4.6 06/06/2022 1020   WBC 6.3 04/24/2022 1004   RBC 4.70 06/06/2022 1020   HGB 14.7 06/06/2022 1020   HCT 44.0 06/06/2022 1020   PLT 212 06/06/2022 1020    MCV 93.6 06/06/2022 1020   MCH 31.3 06/06/2022 1020   MCHC 33.4 06/06/2022 1020   RDW 12.4 06/06/2022 1020   LYMPHSABS 1.1 06/06/2022 1020   MONOABS 0.8 06/06/2022 1020   EOSABS 0.1 06/06/2022 1020   BASOSABS 0.1 06/06/2022 1020     CMP     Component Value Date/Time   NA 138 04/22/2022 1738   K 4.1 04/22/2022 1738   CL 100 04/22/2022 1738   CO2 29 04/22/2022 1738   GLUCOSE 130 (H) 04/22/2022 1738   BUN 8 04/22/2022 1738   CREATININE 0.93 04/22/2022 1738   CALCIUM 10.0 04/22/2022 1738   PROT 7.5 03/29/2021 1724   ALBUMIN 3.9 03/29/2021 1724   AST 16 03/29/2021 1724   ALT 12 03/29/2021 1724   ALKPHOS 58 03/29/2021 1724   BILITOT 0.2 (L) 03/29/2021 1724   GFRNONAA >60 04/22/2022 1738     ASSESSMENT & PLAN:  PLAN:  No orders of the defined types were placed in this encounter.     All questions were answered. The patient knows to call the clinic with any problems, questions or concerns. No barriers to learning were detected. I spent *** counseling the patient face to face. The total time spent in the appointment was *** and more than 50% was on counseling, review of test results, and coordination of care.   Cira Rue, NP-C @DATE @

## 2022-07-16 ENCOUNTER — Other Ambulatory Visit: Payer: Self-pay

## 2022-07-16 ENCOUNTER — Inpatient Hospital Stay: Payer: Managed Care, Other (non HMO)

## 2022-07-16 ENCOUNTER — Inpatient Hospital Stay: Payer: Managed Care, Other (non HMO) | Attending: Hematology | Admitting: Nurse Practitioner

## 2022-07-16 ENCOUNTER — Encounter: Payer: Self-pay | Admitting: Nurse Practitioner

## 2022-07-16 VITALS — BP 126/70 | HR 64 | Temp 98.2°F | Resp 18 | Ht 68.0 in | Wt 166.1 lb

## 2022-07-16 DIAGNOSIS — Z7982 Long term (current) use of aspirin: Secondary | ICD-10-CM | POA: Diagnosis not present

## 2022-07-16 DIAGNOSIS — D509 Iron deficiency anemia, unspecified: Secondary | ICD-10-CM | POA: Insufficient documentation

## 2022-07-16 DIAGNOSIS — R21 Rash and other nonspecific skin eruption: Secondary | ICD-10-CM | POA: Diagnosis not present

## 2022-07-16 DIAGNOSIS — D500 Iron deficiency anemia secondary to blood loss (chronic): Secondary | ICD-10-CM

## 2022-07-16 DIAGNOSIS — D751 Secondary polycythemia: Secondary | ICD-10-CM | POA: Insufficient documentation

## 2022-07-16 DIAGNOSIS — G08 Intracranial and intraspinal phlebitis and thrombophlebitis: Secondary | ICD-10-CM | POA: Insufficient documentation

## 2022-07-16 DIAGNOSIS — E538 Deficiency of other specified B group vitamins: Secondary | ICD-10-CM | POA: Insufficient documentation

## 2022-07-16 DIAGNOSIS — T561X1A Toxic effect of mercury and its compounds, accidental (unintentional), initial encounter: Secondary | ICD-10-CM

## 2022-07-16 LAB — CBC WITH DIFFERENTIAL (CANCER CENTER ONLY)
Abs Immature Granulocytes: 0.01 10*3/uL (ref 0.00–0.07)
Basophils Absolute: 0.1 10*3/uL (ref 0.0–0.1)
Basophils Relative: 1 %
Eosinophils Absolute: 0.1 10*3/uL (ref 0.0–0.5)
Eosinophils Relative: 2 %
HCT: 43.7 % (ref 36.0–46.0)
Hemoglobin: 14.7 g/dL (ref 12.0–15.0)
Immature Granulocytes: 0 %
Lymphocytes Relative: 30 %
Lymphs Abs: 1.7 10*3/uL (ref 0.7–4.0)
MCH: 31.1 pg (ref 26.0–34.0)
MCHC: 33.6 g/dL (ref 30.0–36.0)
MCV: 92.4 fL (ref 80.0–100.0)
Monocytes Absolute: 0.4 10*3/uL (ref 0.1–1.0)
Monocytes Relative: 7 %
Neutro Abs: 3.3 10*3/uL (ref 1.7–7.7)
Neutrophils Relative %: 60 %
Platelet Count: 207 10*3/uL (ref 150–400)
RBC: 4.73 MIL/uL (ref 3.87–5.11)
RDW: 12.1 % (ref 11.5–15.5)
WBC Count: 5.4 10*3/uL (ref 4.0–10.5)
nRBC: 0 % (ref 0.0–0.2)

## 2022-07-16 LAB — IRON AND IRON BINDING CAPACITY (CC-WL,HP ONLY)
Iron: 85 ug/dL (ref 28–170)
Saturation Ratios: 21 % (ref 10.4–31.8)
TIBC: 398 ug/dL (ref 250–450)
UIBC: 313 ug/dL (ref 148–442)

## 2022-07-16 LAB — FERRITIN: Ferritin: 29 ng/mL (ref 11–307)

## 2022-07-19 LAB — HEAVY METALS, BLOOD
Arsenic: 7 ug/L (ref 0–9)
Lead: <1 ug/dL (ref 0.0–3.4)
Mercury: 4.2 ug/L (ref 0.0–14.9)

## 2022-08-08 ENCOUNTER — Encounter: Payer: Self-pay | Admitting: Obstetrics and Gynecology

## 2022-08-08 DIAGNOSIS — Z1329 Encounter for screening for other suspected endocrine disorder: Secondary | ICD-10-CM

## 2022-08-20 ENCOUNTER — Other Ambulatory Visit: Payer: Managed Care, Other (non HMO)

## 2022-08-20 DIAGNOSIS — Z1329 Encounter for screening for other suspected endocrine disorder: Secondary | ICD-10-CM

## 2022-08-21 LAB — TSH+FREE T4
Free T4: 1.57 ng/dL (ref 0.82–1.77)
TSH: 0.475 u[IU]/mL (ref 0.450–4.500)

## 2022-09-02 ENCOUNTER — Ambulatory Visit: Payer: Managed Care, Other (non HMO) | Admitting: Obstetrics and Gynecology

## 2022-09-02 NOTE — Progress Notes (Unsigned)
PCP:  Pcp, No   No chief complaint on file.    HPI:      [name] is a 22 y.o. G0P0000 who LMP was No LMP recorded., presents today for her annual examination.  Her menses are regular every 28-30 days, lasting 7 days, mod to heavy flow for 5 days, changing products Q2 hrs on eliquis (s/p IDA and getting Fe transfusions currently).  Dysmenorrhea mild, occurring first 1-2 days of flow, improved with tylenol, no BTB.    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 Pt currently on Eliquis for 9 months. Seeing neuro and hematology for IDA tx. Neuro is ok for pt to have prog only products for [facility] and menorrhagia (I secure chat messaged him today to confirm).   Sex activity: single partner, contraception--condoms every time.  Last Pap: N/A due to age Hx of STDs: none  There is no FH of breast cancer. There is a FH of ovarian cancer in her mat grt aunt; genetic testing not indicated for pt. The patient does self-breast exams.  Tobacco use: The patient denies current or previous tobacco use. Alcohol use: social with friends No drug use.  Exercise: mod active  She does get adequate calcium but not Vitamin D in her diet. Gard completed Normal thyroid labs 2/24  Past Medical History:  Diagnosis Date   Chronic cerebral venous infarction    Vaccine for human papilloma virus (HPV) types 6, 11, 16, and 18 administered     Past Surgical History:  Procedure Laterality Date   NO PAST SURGERIES      Family History  Problem Relation Age of Onset   Colon cancer Paternal Aunt    Ovarian cancer Other     Social History   Socioeconomic History   Marital status: Single    Spouse name: Not on file   Number of children: Not on file   Years of education: Not on file   Highest education level: Not on file  Occupational History   Not on file  Tobacco Use   Smoking status: Never   Smokeless tobacco: Never   Vaping Use   Vaping Use: Never used  Substance and Sexual Activity   Alcohol use: Yes   Drug use: No   Sexual activity: Yes    Birth control/protection: None, Condom  Other Topics Concern   Not on file  Social History Narrative   L[facility] on campus [facility]   Right Handed   Drinks caffeine rarely   Social Determinants of Health   Financial Resource Strain: Not on file  Food Insecurity: Not on file  Transportation Needs: Not on file  Physical Activity: Not on file  Stress: Not on file  Social Connections: Not on file  Intimate Partner Violence: Not on file    Outpatient Medications Prior to Visit  Medication Sig Dispense Refill   aspirin EC 81 MG tablet Take 1 tablet (81 mg total) by mouth daily. Swallow whole. 30 tablet 12   folic acid (FOLVITE) 1 MG tablet Take 1 tablet (1 mg total) by mouth daily. 100 tablet 3   No facility-administered medications prior to visit.    ROS:  Review of Systems  Constitutional:  Negative for fatigue, fever and unexpected weight change.  Respiratory:  Negative for cough, shortness of breath and wheezing.   Cardiovascular:  Negative for chest pain, palpitations and leg swelling.  Gastrointestinal:  Negative for blood  in stool, constipation, diarrhea, nausea and vomiting.  Endocrine: Negative for cold intolerance, heat intolerance and polyuria.  Genitourinary:  Negative for dyspareunia, dysuria, flank pain, frequency, genital sores, hematuria, menstrual problem, pelvic pain, urgency, vaginal bleeding, vaginal discharge and vaginal pain.  Musculoskeletal:  Negative for back pain, joint swelling and myalgias.  Skin:  Negative for rash.  Neurological:  Negative for dizziness, syncope, light-headedness, numbness and headaches.  Hematological:  Negative for adenopathy.  Psychiatric/Behavioral:  Negative for agitation, confusion, sleep disturbance and suicidal ideas. The patient is not nervous/anxious.   BREAST: No symptoms   Objective: There  were no vitals taken for this visit.   Physical Exam Constitutional:      Appearance: She is well-developed.  Genitourinary:     Vulva normal.     Right Labia: No rash, tenderness or lesions.    Left Labia: No tenderness, lesions or rash.    No vaginal discharge, erythema or tenderness.      Right Adnexa: not tender and no mass present.    Left Adnexa: not tender and no mass present.    No cervical motion tenderness, friability or polyp.     Uterus is not enlarged or tender.  Breasts:    Right: No mass, nipple discharge, skin change or tenderness.     Left: No mass, nipple discharge, skin change or tenderness.  Neck:     Thyroid: No thyromegaly.  Cardiovascular:     Rate and Rhythm: Normal rate and regular rhythm.     Heart sounds: Normal heart sounds. No murmur heard. Pulmonary:     Effort: Pulmonary effort is normal.     Breath sounds: Normal breath sounds.  Abdominal:     Palpations: Abdomen is soft.     Tenderness: There is no abdominal tenderness. There is no guarding or rebound.  Musculoskeletal:        General: Normal range of motion.     Cervical back: Normal range of motion.  Lymphadenopathy:     Cervical: No cervical adenopathy.  Neurological:     General: No focal deficit present.     Mental Status: She is alert and oriented to person, place, and time.     Cranial Nerves: No cranial nerve deficit.  Skin:    General: Skin is warm and dry.  Psychiatric:        Mood and Affect: Mood normal.        Behavior: Behavior normal.        Thought Content: Thought content normal.        Judgment: Judgment normal.  Vitals reviewed.    Assessment/Plan: Encounter for annual routine gynecological examination  Screening for STD (sexually transmitted disease) - Plan: Chlamydia/Gonococcus/Trichomonas, NAA  Encounter for initial prescription of contraceptive pills - Plan: norethindrone (MICRONOR) 0.35 MG tablet--prog only options discussed (ok per neuro). Pt declines  nexplanon/IUD. Considering POPs vs depo. POPs probably better per CDC guidelines. Pt to start today, condoms. Aware of 3 hr time window.  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 On eliquis for 9 months. Getting Fe transfusions.    No orders of the defined types were placed in this encounter.            GYN counsel adequate intake of calcium and vitamin D, diet and exercise     F/U  No follow-ups on file.  Griffon Herberg B. Verlon Carcione, PA-C 09/02/2022 9:26 PM

## 2022-09-03 ENCOUNTER — Encounter: Payer: Self-pay | Admitting: Obstetrics and Gynecology

## 2022-09-03 ENCOUNTER — Ambulatory Visit (INDEPENDENT_AMBULATORY_CARE_PROVIDER_SITE_OTHER): Payer: Managed Care, Other (non HMO) | Admitting: Obstetrics and Gynecology

## 2022-09-03 ENCOUNTER — Other Ambulatory Visit: Payer: Self-pay | Admitting: Obstetrics and Gynecology

## 2022-09-03 VITALS — BP 102/64 | Ht 68.0 in | Wt 165.0 lb

## 2022-09-03 DIAGNOSIS — Z30011 Encounter for initial prescription of contraceptive pills: Secondary | ICD-10-CM

## 2022-09-03 DIAGNOSIS — G08 Intracranial and intraspinal phlebitis and thrombophlebitis: Secondary | ICD-10-CM

## 2022-09-03 DIAGNOSIS — E049 Nontoxic goiter, unspecified: Secondary | ICD-10-CM | POA: Diagnosis not present

## 2022-09-03 DIAGNOSIS — Z113 Encounter for screening for infections with a predominantly sexual mode of transmission: Secondary | ICD-10-CM

## 2022-09-03 DIAGNOSIS — Z01411 Encounter for gynecological examination (general) (routine) with abnormal findings: Secondary | ICD-10-CM | POA: Diagnosis not present

## 2022-09-03 DIAGNOSIS — Z124 Encounter for screening for malignant neoplasm of cervix: Secondary | ICD-10-CM

## 2022-09-03 DIAGNOSIS — Z01419 Encounter for gynecological examination (general) (routine) without abnormal findings: Secondary | ICD-10-CM

## 2022-09-03 DIAGNOSIS — Z1329 Encounter for screening for other suspected endocrine disorder: Secondary | ICD-10-CM

## 2022-09-03 MED ORDER — NORETHINDRONE 0.35 MG PO TABS: 1.0000 | ORAL_TABLET | Freq: Every day | ORAL | 3 refills | Status: DC

## 2022-09-03 NOTE — Progress Notes (Signed)
Rx POPs to mail order, #1 mo to [facility].

## 2022-09-03 NOTE — Patient Instructions (Signed)
I value your feedback and you entrusting us with your care. If you get a [facility] patient survey, I would appreciate you taking the time to let us know about your experience today. Thank you! ? ? ?

## 2022-09-04 NOTE — Telephone Encounter (Signed)
Called pharmacy to make change to 1 pack 0 refills.

## 2022-09-08 LAB — IGP,CTNGTV,RFX APTIMA HPV ASCU
Chlamydia, Nuc. Acid Amp: NEGATIVE
Gonococcus, Nuc. Acid Amp: NEGATIVE
Trich vag by NAA: NEGATIVE

## 2022-09-11 ENCOUNTER — Encounter: Payer: Self-pay | Admitting: Obstetrics and Gynecology

## 2022-09-11 NOTE — Telephone Encounter (Signed)
Does [facility] do thyroid u/s either in [place] or [place]? Can we get price to see if more affordable for pt? Thx

## 2022-09-12 ENCOUNTER — Ambulatory Visit: Payer: Managed Care, Other (non HMO)

## 2022-09-18 ENCOUNTER — Ambulatory Visit (INDEPENDENT_AMBULATORY_CARE_PROVIDER_SITE_OTHER): Payer: Managed Care, Other (non HMO)

## 2022-09-18 VITALS — BP 112/70 | Ht 68.0 in | Wt 160.0 lb

## 2022-09-18 DIAGNOSIS — Z3201 Encounter for pregnancy test, result positive: Secondary | ICD-10-CM | POA: Diagnosis not present

## 2022-09-18 DIAGNOSIS — N912 Amenorrhea, unspecified: Secondary | ICD-10-CM

## 2022-09-18 LAB — POCT URINE PREGNANCY: Preg Test, Ur: POSITIVE — AB

## 2022-09-18 NOTE — Progress Notes (Signed)
    NURSE VISIT NOTE  Subjective:    Patient ID: Tammy Weber, female    DOB: [birth year], 22 y.o.   MRN: [id number]  HPI  Patient is a 22 y.o. G0P0000 female who presents for evaluation of amenorrhea. She believes she could be pregnant. Pregnancy is not desired. Patient is requesting information on termination of pregnancy. Sexual Activity: has sex with males. Last period was normal. Gave patient list of local pregnancy termination clinics via mychart, and she is to follow up with [place] 1-2 weeks after terminating pregnancy.    Objective:    BP 112/70   Ht 5\' 8"  (1.727 m)   Wt 160 lb (72.6 kg)   LMP 08/06/2022 (Exact Date)   BMI 24.33 kg/m   Lab Review  Results for orders placed or performed in visit on 09/18/22  POCT urine pregnancy  Result Value Ref Range   Preg Test, Ur Positive (A) Negative    Assessment:   1. Amenorrhea     Drenda Freeze, CMA

## 2022-09-30 DIAGNOSIS — E041 Nontoxic single thyroid nodule: Secondary | ICD-10-CM

## 2022-09-30 HISTORY — DX: Nontoxic single thyroid nodule: E04.1

## 2022-10-08 ENCOUNTER — Ambulatory Visit
Admission: RE | Admit: 2022-10-08 | Discharge: 2022-10-08 | Disposition: A | Discharge status: Home / Self Care | Payer: Managed Care, Other (non HMO) | Source: Ambulatory Visit | Attending: Obstetrics and Gynecology

## 2022-10-08 DIAGNOSIS — E049 Nontoxic goiter, unspecified: Secondary | ICD-10-CM

## 2022-10-10 ENCOUNTER — Encounter: Payer: Self-pay | Admitting: Obstetrics and Gynecology

## 2022-10-12 ENCOUNTER — Encounter: Payer: Self-pay | Admitting: Obstetrics and Gynecology

## 2022-11-21 ENCOUNTER — Ambulatory Visit
Admission: RE | Admit: 2022-11-21 | Discharge: 2022-11-21 | Disposition: A | Discharge status: Home / Self Care | Payer: Managed Care, Other (non HMO) | Source: Ambulatory Visit | Attending: Emergency Medicine | Admitting: Emergency Medicine

## 2022-11-21 ENCOUNTER — Encounter: Payer: Self-pay | Admitting: Hematology

## 2022-11-21 VITALS — BP 130/75 | HR 97 | Temp 98.2°F | Resp 18

## 2022-11-21 DIAGNOSIS — L239 Allergic contact dermatitis, unspecified cause: Secondary | ICD-10-CM

## 2022-11-21 DIAGNOSIS — H029 Unspecified disorder of eyelid: Secondary | ICD-10-CM

## 2022-11-21 MED ORDER — ERYTHROMYCIN 5 MG/GM OP OINT: TOPICAL_OINTMENT | OPHTHALMIC | 0 refills | Status: DC

## 2022-11-21 NOTE — ED Provider Notes (Signed)
Tammy Weber    CSN: [id number] Arrival date & time: 11/21/22  1543      History   Chief Complaint Chief Complaint  Patient presents with   Rash    Skin rash around eye - Entered by patient    HPI Tammy Weber is a 22 y.o. female.  Patient presents with intermittent rash around her right eye x 1 year.  Treatment attempted with Aquaphor and other lotions.  She denies eye injury, change in vision, eye pain, fever, or other symptoms.  She has not sought treatment for this prior to today.    The history is provided by the patient and medical records.    Past Medical History:  Diagnosis Date   Chronic cerebral venous infarction    Thyroid nodule 09/2022   LT side; requires yeary u/s f/u for 5 yrs for stability   Vaccine for human papilloma virus (HPV) types 6, 11, 16, and 18 administered     Patient Active Problem List   Diagnosis Date Noted   Iron deficiency anemia due to chronic blood loss 05/01/2021   Vitamin B 12 deficiency 04/24/2021   Cerebral venous thrombosis 03/30/2021   Acute cerebral venous sinus thrombosis 03/29/2021   Anemia 03/29/2021   Hypertriglyceridemia 07/14/2019    Past Surgical History:  Procedure Laterality Date   NO PAST SURGERIES      OB History     Gravida  1   Para  0   Term  0   Preterm  0   AB  0   Living  0      SAB  0   IAB  0   Ectopic  0   Multiple  0   Live Births  0            Home Medications    Prior to Admission medications   Medication Sig Start Date End Date Taking? Authorizing Provider  erythromycin ophthalmic ointment Place a 1/2 inch ribbon of ointment into the lower eyelid four times a day for 7 days. 11/21/22  Yes Mickie Bail, NP  aspirin EC 81 MG tablet Take 1 tablet (81 mg total) by mouth daily. Swallow whole. 01/22/22   Micki Riley, MD  folic acid (FOLVITE) 1 MG tablet Take 1 tablet (1 mg total) by mouth daily. 05/05/22 05/05/23  Micki Riley, MD  norethindrone (MICRONOR)  0.35 MG tablet Take 1 tablet (0.35 mg total) by mouth daily. Patient not taking: Reported on 09/18/2022 09/03/22   Copland, Ilona Sorrel, PA-C    Family History Family History  Problem Relation Age of Onset   Pancreatic cancer Paternal Grandmother    Colon cancer Paternal Aunt    Ovarian cancer Other     Social History Social History   Tobacco Use   Smoking status: Some Days    Types: Cigarettes   Smokeless tobacco: Never  Vaping Use   Vaping Use: Some days  Substance Use Topics   Alcohol use: Yes   Drug use: No     Allergies   Latex   Review of Systems Review of Systems  Constitutional:  Negative for chills and fever.  HENT:  Negative for ear pain and sore throat.   Eyes:  Negative for pain, discharge, redness, itching and visual disturbance.  Respiratory:  Negative for cough and shortness of breath.   Skin:  Positive for color change and rash.     Physical Exam Triage Vital Signs ED Triage Vitals  Enc Vitals Group     BP 11/21/22 1548 130/75     Pulse Rate 11/21/22 1548 97     Resp 11/21/22 1548 18     Temp 11/21/22 1548 98.2 F (36.8 C)     Temp src --      SpO2 11/21/22 1548 98 %     Weight --      Height --      Head Circumference --      Peak Flow --      Pain Score 11/21/22 1547 0     Pain Loc --      Pain Edu? --      Excl. in GC? --    No data found.  Updated Vital Signs BP 130/75 (BP Location: Left Arm)   Pulse 97   Temp 98.2 F (36.8 C)   Resp 18   LMP 11/20/2022 (Exact Date)   SpO2 98%   Breastfeeding No   Visual Acuity Right Eye Distance:   Left Eye Distance:   Bilateral Distance:    Right Eye Near:   Left Eye Near:    Bilateral Near:     Physical Exam Vitals and nursing note reviewed.  Constitutional:      General: She is not in acute distress.    Appearance: Normal appearance. She is well-developed. She is not ill-appearing.  HENT:     Right Ear: Tympanic membrane normal.     Left Ear: Tympanic membrane normal.      Nose: Nose normal.     Mouth/Throat:     Mouth: Mucous membranes are moist.     Pharynx: Oropharynx is clear.  Eyes:     General: Vision grossly intact.        Right eye: No discharge.        Left eye: No discharge.     Extraocular Movements: Extraocular movements intact.     Conjunctiva/sclera: Conjunctivae normal.     Right eye: Right conjunctiva is not injected.     Left eye: Left conjunctiva is not injected.     Pupils: Pupils are equal, round, and reactive to light.   Cardiovascular:     Rate and Rhythm: Normal rate and regular rhythm.     Heart sounds: Normal heart sounds.  Pulmonary:     Effort: Pulmonary effort is normal. No respiratory distress.     Breath sounds: Normal breath sounds.  Musculoskeletal:     Cervical back: Neck supple.  Skin:    General: Skin is warm and dry.  Neurological:     Mental Status: She is alert.  Psychiatric:        Mood and Affect: Mood normal.        Behavior: Behavior normal.      UC Treatments / Results  Labs (all labs ordered are listed, but only abnormal results are displayed) Labs Reviewed - No data to display  EKG   Radiology No results found.  Procedures Procedures (including critical care time)  Medications Ordered in UC Medications - No data to display  Initial Impression / Assessment and Plan / UC Course  I have reviewed the triage vital signs and the nursing notes.  Pertinent labs & imaging results that were available during my care of the patient were reviewed by me and considered in my medical decision making (see chart for details).    Eyelid lesion, allergic contact dermatitis.  The rash around her right eye has been present intermittently for a year.  The lesion has been present for several months.  Treating the lesion with erythromycin eye ointment.  Discussed symptomatic treatment for allergic dermatitis.  Instructed patient to follow up with her PCP or eye care provider.  She agrees to plan of care.     Final Clinical Impressions(s) / UC Diagnoses   Final diagnoses:  Eyelid lesion  Allergic contact dermatitis, unspecified trigger     Discharge Instructions      Use the eye ointment as directed.  Follow up with your primary care provider or eye care provider.        ED Prescriptions     Medication Sig Dispense Auth. Provider   erythromycin ophthalmic ointment Place a 1/2 inch ribbon of ointment into the lower eyelid four times a day for 7 days. 3.5 g Mickie Bail, NP      PDMP not reviewed this encounter.   Mickie Bail, NP 11/21/22 1620

## 2022-11-21 NOTE — Discharge Instructions (Signed)
Use the eye ointment as directed.  Follow up with your primary care provider or eye care provider.

## 2022-11-21 NOTE — ED Triage Notes (Signed)
Patient presents to [facility] for intermittent rash underneath right eye x 1 year. Treating with Aquaphor and different other creams that she does not know name of. No pain or irritation.

## 2023-01-13 ENCOUNTER — Other Ambulatory Visit: Payer: Self-pay | Admitting: Nurse Practitioner

## 2023-01-13 DIAGNOSIS — D500 Iron deficiency anemia secondary to blood loss (chronic): Secondary | ICD-10-CM

## 2023-01-13 NOTE — Progress Notes (Unsigned)
Patient Care Team: Pcp, No as PCP - General   CHIEF COMPLAINT: Follow up h/o venous sinus thrombosis, and folic acid deficiency   CURRENT THERAPY:  Treated with Eliquis 03/2021-12/2021, now on baby aspirin, and oral folic acid   INTERVAL HISTORY Tammy Weber returns for follow up as scheduled. Last seen by me 07/16/22.  Doing well overall, no signs of recurrent thrombosis.  She continues daily aspirin, but holds during her periods which are heavy, but getting shorter.  LMP 7/4 - 7/8.  Denies other bleeding.  She has had nodule on her right eye for about a year, seen in urgent care and was told allergic dermatitis, the ointment she was given does not work.  Really bothers her.  She also felt a "knot, on her left forearm a few months ago  ROS  All other systems reviewed and negative  Past Medical History:  Diagnosis Date   Chronic cerebral venous infarction    Thyroid nodule 09/2022   LT side; requires yeary u/s f/u for 5 yrs for stability   Vaccine for human papilloma virus (HPV) types 6, 11, 16, and 18 administered      Past Surgical History:  Procedure Laterality Date   NO PAST SURGERIES       Outpatient Encounter Medications as of 01/14/2023  Medication Sig   aspirin EC 81 MG tablet Take 1 tablet (81 mg total) by mouth daily. Swallow whole.   folic acid (FOLVITE) 1 MG tablet Take 1 tablet (1 mg total) by mouth daily.   norethindrone (MICRONOR) 0.35 MG tablet Take 1 tablet (0.35 mg total) by mouth daily.   erythromycin ophthalmic ointment Place a 1/2 inch ribbon of ointment into the lower eyelid four times a day for 7 days. (Patient not taking: Reported on 01/14/2023)   No facility-administered encounter medications on file as of 01/14/2023.     Today's Vitals   01/14/23 1016  BP: 113/74  Pulse: 71  Resp: 17  Temp: 98.7 F (37.1 C)  TempSrc: Oral  SpO2: 100%  Weight: 158 lb 9.6 oz (71.9 kg)   Body mass index is 24.12 kg/m.   PHYSICAL EXAM GENERAL:alert, no distress  and comfortable SKIN: no rash  EYES: sclera clear. Small lesion/nodule near R inner canthus NECK: without mass LYMPH:  no palpable cervical or supraclavicular lymphadenopathy  LUNGS: clear with normal breathing effort HEART: regular rate & rhythm, no lower extremity edema NEURO: alert & oriented x 3 with fluent speech, no focal motor/sensory deficits MSK: subtle/tiny nodule at left lateral forearm. No erythema, edema, ttp  CBC    Component Value Date/Time   WBC 7.6 01/14/2023 0953   WBC 6.3 04/24/2022 1004   RBC 4.85 01/14/2023 0953   HGB 14.7 01/14/2023 0953   HCT 43.3 01/14/2023 0953   PLT 233 01/14/2023 0953   MCV 89.3 01/14/2023 0953   MCH 30.3 01/14/2023 0953   MCHC 33.9 01/14/2023 0953   RDW 12.5 01/14/2023 0953   LYMPHSABS 1.9 01/14/2023 0953   MONOABS 0.3 01/14/2023 0953   EOSABS 0.0 01/14/2023 0953   BASOSABS 0.1 01/14/2023 0953     CMP     Component Value Date/Time   NA 138 04/22/2022 1738   K 4.1 04/22/2022 1738   CL 100 04/22/2022 1738   CO2 29 04/22/2022 1738   GLUCOSE 130 (H) 04/22/2022 1738   BUN 8 04/22/2022 1738   CREATININE 0.93 04/22/2022 1738   CALCIUM 10.0 04/22/2022 1738   PROT 7.5 03/29/2021 1724  ALBUMIN 3.9 03/29/2021 1724   AST 16 03/29/2021 1724   ALT 12 03/29/2021 1724   ALKPHOS 58 03/29/2021 1724   BILITOT 0.2 (L) 03/29/2021 1724   GFRNONAA >60 04/22/2022 1738     ASSESSMENT & PLAN:Tammy Weber is a 22 y.o. 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 CT head venogram confirmed right transverse and sigmoid sinus thrombosis. She was discharged with 5 days of Lovenox then oral Pradaxa. Birth control pills were discontinued at that time. -returned 03/29/21 with worsening headache with muffled hearing. Brain MRI showed mild progression of CVST. -she received heparin infusion while in the hospital. She was discharged on Eliquis, tolerated well -she had negative factor  V Leiden mutation, prothrombin gene mutation, beta-2 glycoprotein and cardiolipin antibodies -repeat brain MRI/MRV on Jul 16, 2021 showed improved flow signal. Repeat on 12/23/21 showed no change from 07/2021, with chronic thrombosis.  -Dr. Pearlean Brownie stopped Eliquis and changed her to baby aspirin 81 mg at the end of 12/2021. Repeated hypercoagulable panel 04/16/22 was negative except elevated homocysteine level (19.5), which normalized 07/2022 -She developed near syncope in 03/2022, work up showed elevated hgb 16.2, stat MRV 04/25/22 was stable. This has resolved. -Tammy Weber appears well. Tolerating ASA except more bleeding during menses, she holds it for 4-5 days. No signs of recurrent thrombosis -labs reviewed, CBC and CMP normal, iron/ferritin are normal, folic acid is pending -Continue baby aspirin and risk reducing behaviors. She avoids estrogen. She is trying to quit smoking.  -F/up in 1 year, or sooner if needed   2. Rash, right eye lid/inner canthus -etiology unknown, they have been treating as eczema -OTC's without relief -She went to urgent care recently, told it was allergic dermatitis; she does not have pcp -I referred her to eye specialist    3. Polycythemia  -She had mild IDA in the past, then normalized, and she developed mild erythrocytosis with hgb 15.4 in 10/05/21. This has also been mild and intermittent, highest hgb 16.3 in 8/23 -She went to ED recently for pre-syncope, hgb/hct 16.2/50.6; she was felt to be dehydrated and treated for UTI. She states she is well hydrated.  -04/24/22: 16.2/46.2. this is likely reactive, given the mild intermittent nature -resolved on last visit    4. Anemia, iron deficiency and B12 deficiency -she received two doses of ferric gluconate and one B12 while in the hospital, then two doses of B12 and three doses of IV Venofer after discharge. -she notes increasing periods since starting Eliquis, requiring her to change pad every hour, as well as gum bleeding  while brushing her teeth. this was managed by holding eliquis the first few days of her period. -She switched to ASA 81 mg in late 12/2021, LMP in 10/23 lasted 2 weeks -since 03/2022 with mild hgb elevation, she has held oral iron and increased dietary iron.  -consider IV iron if her ferritin is <10.      PLAN: -Labs reviewed, folic acid pending  -Continue baby aspirin and folic acid -Risk reducing behaviors -Referral to eye specialist  -F/up in 1 year, or sooner if needed  Orders Placed This Encounter  Procedures   Ambulatory referral to Ophthalmology    Referral Priority:   Routine    Referral Type:   Consultation    Referral Reason:   Specialty Services Required    Requested Specialty:   Ophthalmology    Number of Visits Requested:   1  All questions were answered. The patient knows to call the clinic with any problems, questions or concerns. No barriers to learning were detected. I spent 20 minutes counseling the patient face to face. The total time spent in the appointment was 30 minutes and more than 50% was on counseling, review of test results, and coordination of care.   Tammy Glad, NP-C 01/14/2023

## 2023-01-14 ENCOUNTER — Encounter: Payer: Self-pay | Admitting: Nurse Practitioner

## 2023-01-14 ENCOUNTER — Other Ambulatory Visit: Payer: Self-pay

## 2023-01-14 ENCOUNTER — Inpatient Hospital Stay: Payer: Managed Care, Other (non HMO) | Admitting: Nurse Practitioner

## 2023-01-14 ENCOUNTER — Inpatient Hospital Stay: Payer: Managed Care, Other (non HMO) | Attending: Nurse Practitioner

## 2023-01-14 VITALS — BP 113/74 | HR 71 | Temp 98.7°F | Resp 17 | Wt 158.6 lb

## 2023-01-14 DIAGNOSIS — D751 Secondary polycythemia: Secondary | ICD-10-CM | POA: Diagnosis not present

## 2023-01-14 DIAGNOSIS — H579 Unspecified disorder of eye and adnexa: Secondary | ICD-10-CM

## 2023-01-14 DIAGNOSIS — D500 Iron deficiency anemia secondary to blood loss (chronic): Secondary | ICD-10-CM

## 2023-01-14 DIAGNOSIS — G08 Intracranial and intraspinal phlebitis and thrombophlebitis: Secondary | ICD-10-CM | POA: Insufficient documentation

## 2023-01-14 DIAGNOSIS — E538 Deficiency of other specified B group vitamins: Secondary | ICD-10-CM | POA: Insufficient documentation

## 2023-01-14 DIAGNOSIS — D509 Iron deficiency anemia, unspecified: Secondary | ICD-10-CM | POA: Insufficient documentation

## 2023-01-14 LAB — IRON AND IRON BINDING CAPACITY (CC-WL,HP ONLY)
Iron: 74 ug/dL (ref 28–170)
Saturation Ratios: 17 % (ref 10.4–31.8)
TIBC: 430 ug/dL (ref 250–450)
UIBC: 356 ug/dL (ref 148–442)

## 2023-01-14 LAB — CBC WITH DIFFERENTIAL (CANCER CENTER ONLY)
Abs Immature Granulocytes: 0.02 10*3/uL (ref 0.00–0.07)
Basophils Absolute: 0.1 10*3/uL (ref 0.0–0.1)
Basophils Relative: 1 %
Eosinophils Absolute: 0 10*3/uL (ref 0.0–0.5)
Eosinophils Relative: 0 %
HCT: 43.3 % (ref 36.0–46.0)
Hemoglobin: 14.7 g/dL (ref 12.0–15.0)
Immature Granulocytes: 0 %
Lymphocytes Relative: 25 %
Lymphs Abs: 1.9 10*3/uL (ref 0.7–4.0)
MCH: 30.3 pg (ref 26.0–34.0)
MCHC: 33.9 g/dL (ref 30.0–36.0)
MCV: 89.3 fL (ref 80.0–100.0)
Monocytes Absolute: 0.3 10*3/uL (ref 0.1–1.0)
Monocytes Relative: 4 %
Neutro Abs: 5.3 10*3/uL (ref 1.7–7.7)
Neutrophils Relative %: 70 %
Platelet Count: 233 10*3/uL (ref 150–400)
RBC: 4.85 MIL/uL (ref 3.87–5.11)
RDW: 12.5 % (ref 11.5–15.5)
WBC Count: 7.6 10*3/uL (ref 4.0–10.5)
nRBC: 0 % (ref 0.0–0.2)

## 2023-01-14 LAB — FERRITIN: Ferritin: 19 ng/mL (ref 11–307)

## 2023-01-15 LAB — FOLATE RBC
Folate, Hemolysate: 341 ng/mL
Folate, RBC: 766 ng/mL
Hematocrit: 44.5 % (ref 34.0–46.6)

## 2023-01-19 ENCOUNTER — Encounter: Payer: Self-pay | Admitting: Nurse Practitioner

## 2023-05-06 ENCOUNTER — Emergency Department
Admission: EM | Admit: 2023-05-06 | Discharge: 2023-05-06 | Disposition: A | Discharge status: Home / Self Care | Payer: Managed Care, Other (non HMO) | Attending: Emergency Medicine | Admitting: Emergency Medicine

## 2023-05-06 ENCOUNTER — Emergency Department: Payer: Managed Care, Other (non HMO)

## 2023-05-06 ENCOUNTER — Other Ambulatory Visit: Payer: Self-pay

## 2023-05-06 ENCOUNTER — Encounter: Payer: Self-pay | Admitting: Nurse Practitioner

## 2023-05-06 ENCOUNTER — Encounter: Payer: Self-pay | Admitting: Emergency Medicine

## 2023-05-06 DIAGNOSIS — R519 Headache, unspecified: Secondary | ICD-10-CM | POA: Insufficient documentation

## 2023-05-06 LAB — CBC WITH DIFFERENTIAL/PLATELET
Abs Immature Granulocytes: 0.03 10*3/uL (ref 0.00–0.07)
Basophils Absolute: 0.1 10*3/uL (ref 0.0–0.1)
Basophils Relative: 1 %
Eosinophils Absolute: 0.1 10*3/uL (ref 0.0–0.5)
Eosinophils Relative: 1 %
HCT: 43 % (ref 36.0–46.0)
Hemoglobin: 14.6 g/dL (ref 12.0–15.0)
Immature Granulocytes: 0 %
Lymphocytes Relative: 22 %
Lymphs Abs: 2 10*3/uL (ref 0.7–4.0)
MCH: 29.8 pg (ref 26.0–34.0)
MCHC: 34 g/dL (ref 30.0–36.0)
MCV: 87.8 fL (ref 80.0–100.0)
Monocytes Absolute: 0.4 10*3/uL (ref 0.1–1.0)
Monocytes Relative: 5 %
Neutro Abs: 6.7 10*3/uL (ref 1.7–7.7)
Neutrophils Relative %: 71 %
Platelets: 237 10*3/uL (ref 150–400)
RBC: 4.9 MIL/uL (ref 3.87–5.11)
RDW: 12.8 % (ref 11.5–15.5)
WBC: 9.3 10*3/uL (ref 4.0–10.5)
nRBC: 0 % (ref 0.0–0.2)

## 2023-05-06 LAB — POC URINE PREG, ED: Preg Test, Ur: NEGATIVE

## 2023-05-06 LAB — BASIC METABOLIC PANEL
Anion gap: 8 (ref 5–15)
BUN: 11 mg/dL (ref 6–20)
CO2: 24 mmol/L (ref 22–32)
Calcium: 9.1 mg/dL (ref 8.9–10.3)
Chloride: 102 mmol/L (ref 98–111)
Creatinine, Ser: 0.85 mg/dL (ref 0.44–1.00)
GFR, Estimated: >60 mL/min (ref >60)
Glucose, Bld: 115 mg/dL — ABNORMAL HIGH (ref 70–99)
Potassium: 4 mmol/L (ref 3.5–5.1)
Sodium: 134 mmol/L — ABNORMAL LOW (ref 135–145)

## 2023-05-06 MED ORDER — GADOBUTROL 1 MMOL/ML IV SOLN
7.0000 mL | Freq: Once | INTRAVENOUS | Status: AC | PRN
Start: 2023-05-06 — End: 2023-05-06
  Administered 2023-05-06: 7 mL via INTRAVENOUS

## 2023-05-06 NOTE — ED Triage Notes (Signed)
Patient to ED via POV for intermittent headaches on the right side and intermittent ringing in bilateral ears. Ongoing for a few weeks. Pt concerned due to hx of blood clot in brain- Oct 2022. Oncologist told patient to come to ED for emergent MRI.

## 2023-05-06 NOTE — Telephone Encounter (Signed)
I spoke with pt and for the last few weeks she has been having headaches on the right side of her head and ringing in her ears causing her hearing to be muffled.  She stated these are the same symptoms she was having in the past when she was diagnosed with a blood clot  With her past history, pt was advised to go to the ED for an evaluation.  She agreed to this plan of care.

## 2023-05-06 NOTE — ED Provider Notes (Signed)
[facility] Provider Note    Event Date/Time   First MD Initiated Contact with Patient 05/06/23 1621     (approximate)   History   Chief Complaint Headache   HPI  Tammy Weber is a 22 y.o. female with past medical history of anemia and venous sinus thrombosis who presents to the ED complaining of headache.  Patient reports that she has had gradually worsening intermittent throbbing pain over the right side of her head for the past 2 weeks.  She describes symptoms as similar to when she was diagnosed with venous sinus thrombosis about 2 years ago.  She denies any associated fevers, nausea, vomiting, photophobia, vision changes, speech changes, numbness, or weakness.  She was transition from Eliquis to daily baby aspirin by her hematologist last year, initial episode thought to be caused by birth control.     Physical Exam   Triage Vital Signs: ED Triage Vitals  Encounter Vitals Group     BP 05/06/23 1524 127/78     Systolic BP Percentile --      Diastolic BP Percentile --      Pulse Rate 05/06/23 1524 (!) 101     Resp 05/06/23 1524 18     Temp 05/06/23 1524 98.4 F (36.9 C)     Temp Source 05/06/23 1524 Oral     SpO2 05/06/23 1524 100 %     Weight 05/06/23 1525 155 lb (70.3 kg)     Height 05/06/23 1525 5\' 8"  (1.727 m)     Head Circumference --      Peak Flow --      Pain Score 05/06/23 1525 2     Pain Loc --      Pain Education --      Exclude from Growth Chart --     Most recent vital signs: Vitals:   05/06/23 1524  BP: 127/78  Pulse: (!) 101  Resp: 18  Temp: 98.4 F (36.9 C)  SpO2: 100%    Constitutional: Alert and oriented. Eyes: Conjunctivae are normal.  Pupils equal, round, and reactive to light bilaterally. Head: Atraumatic. Nose: No congestion/rhinnorhea. Mouth/Throat: Mucous membranes are moist.  Neck: Supple with no meningismus. Cardiovascular: Normal rate, regular rhythm. Grossly normal heart sounds.  2+ radial  pulses bilaterally. Respiratory: Normal respiratory effort.  No retractions. Lungs CTAB. Gastrointestinal: Soft and nontender. No distention. Musculoskeletal: No lower extremity tenderness nor edema.  Neurologic:  Normal speech and language. No gross focal neurologic deficits are appreciated.    ED Results / Procedures / Treatments   Labs (all labs ordered are listed, but only abnormal results are displayed) Labs Reviewed  BASIC METABOLIC PANEL - Abnormal; Notable for the following components:      Result Value   Sodium 134 (*)    Glucose, Bld 115 (*)    All other components within normal limits  CBC WITH DIFFERENTIAL/PLATELET  POC URINE PREG, ED    RADIOLOGY MRV reviewed and interpreted by me with no evidence of acute venous sinus thrombosis.  PROCEDURES:  Critical Care performed: No  Procedures   MEDICATIONS ORDERED IN ED: Medications  gadobutrol (GADAVIST) 1 MMOL/ML injection 7 mL (7 mLs Intravenous Contrast Given 05/06/23 1634)     IMPRESSION / MDM / ASSESSMENT AND PLAN / ED COURSE  I reviewed the triage vital signs and the nursing notes.  22 y.o. female with past medical history of anemia and venous sinus thrombosis who presents to the ED complaining of increasing throbbing headache on the right side over the past 2 weeks.  Patient's presentation is most consistent with acute presentation with potential threat to life or bodily function.  Differential diagnosis includes, but is not limited to, tension headache, migraine headache, venous sinus thrombosis, meningitis, SAH.  Patient well-appearing and in no acute distress, vital signs are unremarkable.  She has no focal neurologic deficits on exam, but describes headache as similar to when she was diagnosed with venous sinus thrombosis in the past.  MRV was performed and results are pending at this time.  No findings concerning for [facility] or meningitis.  Labs show no significant anemia,  leukocytosis, electrolyte abnormality, or AKI.  Pregnancy testing is negative and LFTs are unremarkable.  Patient declines pain medication, plan to reassess following imaging results.  MRV brain is reassuring with stable residual clot from previous, no acute venous sinus thrombosis noted.  Patient feeling well on reassessment with minimal headache and is appropriate for discharge home with outpatient follow-up.  She was counseled to follow-up with her hematologist and to return to the ED for new or worsening symptoms, patient agrees with plan.      FINAL CLINICAL IMPRESSION(S) / ED DIAGNOSES   Final diagnoses:  Acute nonintractable headache, unspecified headache type     Rx / DC Orders   ED Discharge Orders     None        Note:  This document was prepared using Dragon voice recognition software and may include unintentional dictation errors.   Chesley Noon, MD 05/06/23 [phone]

## 2023-07-04 ENCOUNTER — Ambulatory Visit (INDEPENDENT_AMBULATORY_CARE_PROVIDER_SITE_OTHER): Payer: Managed Care, Other (non HMO)

## 2023-07-04 ENCOUNTER — Ambulatory Visit
Admission: EM | Admit: 2023-07-04 | Discharge: 2023-07-04 | Disposition: A | Discharge status: Home / Self Care | Payer: Managed Care, Other (non HMO) | Attending: Emergency Medicine | Admitting: Emergency Medicine

## 2023-07-04 ENCOUNTER — Other Ambulatory Visit: Payer: Self-pay

## 2023-07-04 DIAGNOSIS — S0992XA Unspecified injury of nose, initial encounter: Secondary | ICD-10-CM

## 2023-07-04 MED ORDER — PREDNISONE 10 MG (21) PO TBPK: ORAL_TABLET | Freq: Every day | ORAL | 0 refills | Status: DC

## 2023-07-04 NOTE — Discharge Instructions (Addendum)
 X-ray is pending, you will be notified of results via telephone  May take prednisone  every morning with food as directed to reduce inflammation and help with pain, may continue Tylenol  use if needed  You may apply ice or heat over the affected area in 10 to 15-minute intervals to help reduce any swelling  If nasal bone is broken you will need to follow-up with orthopedics for further evaluation to determine if surgical intervention is needed
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
## 2023-07-09 ENCOUNTER — Telehealth (INDEPENDENT_AMBULATORY_CARE_PROVIDER_SITE_OTHER): Payer: Self-pay | Admitting: Otolaryngology

## 2023-07-09 NOTE — Telephone Encounter (Signed)
 Called patient to schedule appt for ED Follow Up for nasal injury.  Left vmail message to call our office.

## 2023-08-10 ENCOUNTER — Other Ambulatory Visit: Payer: Self-pay | Admitting: Obstetrics and Gynecology

## 2023-08-10 DIAGNOSIS — Z30011 Encounter for initial prescription of contraceptive pills: Secondary | ICD-10-CM

## 2023-09-05 ENCOUNTER — Encounter: Payer: Self-pay | Admitting: Obstetrics and Gynecology

## 2023-09-14 ENCOUNTER — Other Ambulatory Visit: Payer: Self-pay | Admitting: Obstetrics and Gynecology

## 2023-09-14 DIAGNOSIS — Z30011 Encounter for initial prescription of contraceptive pills: Secondary | ICD-10-CM

## 2023-09-15 ENCOUNTER — Other Ambulatory Visit: Payer: Self-pay

## 2023-09-15 ENCOUNTER — Encounter: Payer: Self-pay | Admitting: Obstetrics and Gynecology

## 2023-09-15 DIAGNOSIS — Z30011 Encounter for initial prescription of contraceptive pills: Secondary | ICD-10-CM

## 2023-09-15 MED ORDER — NORETHINDRONE 0.35 MG PO TABS: 1.0000 | ORAL_TABLET | Freq: Every day | ORAL | 0 refills | Status: DC

## 2023-09-15 NOTE — Telephone Encounter (Signed)
 Patient called back. Prescription filled. Appointment scheduled.

## 2023-10-02 NOTE — Progress Notes (Signed)
 PCP:  Pcp, No   Chief Complaint  Patient presents with   Gynecologic Exam   Urinary Tract Infection    2 weeks ago had the urgency to urinate, no burning.     HPI:      [name] is a 23 y.o. G0P0000 who LMP was Patient's last menstrual period was 09/12/2023 (exact date)., presents today for her annual examination.  Her menses are regular every 28-30 days, lasting 5-6 days, mod to heavy flow for several days, lighter now off Eliquis. Hx of IDA and Fe transfusions with hematology 2024 with normal labs 7/24; has lab f/u 7/25.  Dysmenorrhea mild, occurring first 1-2 days of flow, no meds needed. Has occas BTB.     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 Pt did Eliquis for 9 months. Seeing neuro and he is ok for pt to have prog only products for [facility], [place] and menorrhagia (I secure chat messaged him 1/23 to confirm).   Sex activity: not currently but recently, contraception--POPs; s/p EAB 3/24. No pain/bleeding/dryness. Last Pap: 09/03/22 Results were NILM Hx of STDs: none  There is no FH of breast cancer. There is a FH of ovarian cancer in her mat grt aunt; genetic testing not indicated for pt. There is a FH pancreatic cancer in her PGM and colon cancer in her pat aunt, genetic testing not done. The patient does self-breast exams.  Tobacco use: quit Alcohol use: none Occas MJ drug use.  Exercise: mod active  She does get adequate calcium and Vitamin D in her diet. Gard completed  Normal thyroid labs 2/24. Pt's mom thinks her thyroid looks enlarged; thyroid u/s too expensive last yr so not done. FH hypothyroidism in her mom. Wants labs repeated this yr. Aware can still have enlarged thyroid with normal labs.   Had urinary urgency and dysuria a couple wks ago in [place]; was dehydrated. No other UTI sx, no sx now.   Past Medical History:  Diagnosis Date   Chronic cerebral venous infarction    Thyroid  nodule 09/2022   LT side; requires yeary u/s f/u for 5 yrs for stability   Vaccine for human papilloma virus (HPV) types 6, 11, 16, and 18 administered     Past Surgical History:  Procedure Laterality Date   NO PAST SURGERIES      Family History  Problem Relation Age of Onset   Colon cancer Paternal Aunt    Pancreatic cancer Paternal Grandmother    Ovarian cancer Other     Social History   Socioeconomic History   Marital status: Single    Spouse name: Not on file   Number of children: Not on file   Years of education: Not on file   Highest education level: Not on file  Occupational History   Not on file  Tobacco Use   Smoking status: Some Days    Types: Cigarettes   Smokeless tobacco: Never  Vaping Use   Vaping status: Former  Substance and Sexual Activity   Alcohol use: Yes   Drug use: No   Sexual activity: Yes    Birth control/protection: Pill  Other Topics Concern   Not on file  Social History Narrative   Lives on campus at [facility]   Right Handed   Drinks caffeine rarely   Social Drivers of Health   Financial Resource Strain: Not on file  Food Insecurity: Not on file  Transportation Needs:  Not on file  Physical Activity: Not on file  Stress: Not on file  Social Connections: Not on file  Intimate Partner Violence: Not on file    Outpatient Medications Prior to Visit  Medication Sig Dispense Refill   aspirin EC 81 MG tablet Take 1 tablet (81 mg total) by mouth daily. Swallow whole. 30 tablet 12   folic acid (FOLVITE) 1 MG tablet Take by mouth.     norethindrone (MICRONOR) 0.35 MG tablet Take 1 tablet (0.35 mg total) by mouth daily. 84 tablet 0   erythromycin ophthalmic ointment Place a 1/2 inch ribbon of ointment into the lower eyelid four times a day for 7 days. (Patient not taking: Reported on 07/04/2023) 3.5 g 0   predniSONE (STERAPRED UNI-PAK 21 TAB) 10 MG (21) TBPK tablet Take by mouth daily. Take 6 tabs by mouth daily  for 1 days, then 5 tabs for 1  days, then 4 tabs for 1 days, then 3 tabs for 1 days, 2 tabs for 1 days, then 1 tab by mouth daily for 1 days 21 tablet 0   No facility-administered medications prior to visit.    ROS:  Review of Systems  Constitutional:  Negative for fatigue, fever and unexpected weight change.  Respiratory:  Negative for cough, shortness of breath and wheezing.   Cardiovascular:  Negative for chest pain, palpitations and leg swelling.  Gastrointestinal:  Negative for blood in stool, constipation, diarrhea, nausea and vomiting.  Endocrine: Negative for cold intolerance, heat intolerance and polyuria.  Genitourinary:  Negative for dyspareunia, dysuria, flank pain, frequency, genital sores, hematuria, menstrual problem, pelvic pain, urgency, vaginal bleeding, vaginal discharge and vaginal pain.  Musculoskeletal:  Negative for back pain, joint swelling and myalgias.  Skin:  Negative for rash.  Neurological:  Negative for dizziness, syncope, light-headedness, numbness and headaches.  Hematological:  Negative for adenopathy.  Psychiatric/Behavioral:  Positive for agitation. Negative for confusion, sleep disturbance and suicidal ideas. The patient is not nervous/anxious.   BREAST: No symptoms   Objective: BP 106/64   Ht 5\' 8"  (1.727 m)   Wt 146 lb (66.2 kg)   LMP 09/12/2023 (Exact Date)   BMI 22.20 kg/m    Physical Exam Constitutional:      Appearance: She is well-developed.  Genitourinary:     Vulva normal.     Right Labia: No rash, tenderness or lesions.    Left Labia: No tenderness, lesions or rash.    No vaginal discharge, erythema or tenderness.      Right Adnexa: not tender and no mass present.    Left Adnexa: not tender and no mass present.    No cervical motion tenderness, friability or polyp.     Uterus is not enlarged or tender.  Breasts:    Right: No mass, nipple discharge, skin change or tenderness.     Left: No mass, nipple discharge, skin change or tenderness.  Neck:      Thyroid: Thyromegaly present.     Comments: SLIGHTLY ENLARGED THYROID Cardiovascular:     Rate and Rhythm: Normal rate and regular rhythm.     Heart sounds: Normal heart sounds. No murmur heard. Pulmonary:     Effort: Pulmonary effort is normal.     Breath sounds: Normal breath sounds.  Abdominal:     Palpations: Abdomen is soft.     Tenderness: There is no abdominal tenderness. There is no guarding or rebound.  Musculoskeletal:        General: Normal range of motion.  Cervical back: Normal range of motion.  Lymphadenopathy:     Cervical: No cervical adenopathy.  Neurological:     General: No focal deficit present.     Mental Status: She is alert and oriented to person, place, and time.     Cranial Nerves: No cranial nerve deficit.  Skin:    General: Skin is warm and dry.  Psychiatric:        Mood and Affect: Mood normal.        Behavior: Behavior normal.        Thought Content: Thought content normal.        Judgment: Judgment normal.  Vitals reviewed.     Results for orders placed or performed in visit on 10/06/23 (from the past 24 hours)  POCT Urinalysis Dipstick     Status: Normal   Collection Time: 10/06/23  3:45 PM  Result Value Ref Range   Color, UA pale    Clarity, UA clear    Glucose, UA Negative Negative   Bilirubin, UA neg    Ketones, UA neg    Spec Grav, UA 1.010 1.010 - 1.025   Blood, UA neg    pH, UA 6.0 5.0 - 8.0   Protein, UA Negative Negative   Urobilinogen, UA     Nitrite, UA neg    Leukocytes, UA Negative Negative   Appearance     Odor      Assessment/Plan:  Encounter for annual routine gynecological examination  Screening for STD (sexually transmitted disease) - Plan: Chlamydia/Gonococcus/Trichomonas, NAA  Encounter for surveillance of contraceptive pills - Plan: norethindrone (MICRONOR) 0.35 MG tablet; Rx RF eRxd to [facility] (just got #84 renewal).   Acute cerebral venous sinus thrombosis--no longer doing tx, neuro ok with POPs  Family  history of pancreatic cancer - Plan: [facility] Hered. Cancer Panel; Vistaseq panel done today since pt is LC and qualifies for Lynch syndrome testing  Urinary urgency - Plan: POCT Urinalysis Dipstick; neg UA, sx resolved; most likely due to dehydration  Enlarged thyroid - Plan: TSH + free T4; check labs, discussed thyroid u/s order if desires.   Thyroid disorder screening - Plan: TSH + free T4   Meds ordered this encounter  Medications   norethindrone (MICRONOR) 0.35 MG tablet    Sig: Take 1 tablet (0.35 mg total) by mouth daily.    Dispense:  84 tablet    Refill:  2    Supervising Provider:   Waymon Budge             GYN counsel adequate intake of calcium and vitamin D, diet and exercise     F/U  Return in about 1 year (around 10/05/2024).  Junia Nygren B. Joie Reamer, PA-C 10/06/2023 3:45 PM

## 2023-10-06 ENCOUNTER — Ambulatory Visit (INDEPENDENT_AMBULATORY_CARE_PROVIDER_SITE_OTHER): Admitting: Obstetrics and Gynecology

## 2023-10-06 ENCOUNTER — Encounter: Payer: Self-pay | Admitting: Obstetrics and Gynecology

## 2023-10-06 VITALS — BP 106/64 | Ht 68.0 in | Wt 146.0 lb

## 2023-10-06 DIAGNOSIS — R3915 Urgency of urination: Secondary | ICD-10-CM

## 2023-10-06 DIAGNOSIS — Z1329 Encounter for screening for other suspected endocrine disorder: Secondary | ICD-10-CM

## 2023-10-06 DIAGNOSIS — Z800 Family history of malignant neoplasm of digestive organs: Secondary | ICD-10-CM | POA: Insufficient documentation

## 2023-10-06 DIAGNOSIS — G08 Intracranial and intraspinal phlebitis and thrombophlebitis: Secondary | ICD-10-CM

## 2023-10-06 DIAGNOSIS — Z01419 Encounter for gynecological examination (general) (routine) without abnormal findings: Secondary | ICD-10-CM | POA: Diagnosis not present

## 2023-10-06 DIAGNOSIS — Z3041 Encounter for surveillance of contraceptive pills: Secondary | ICD-10-CM

## 2023-10-06 DIAGNOSIS — Z113 Encounter for screening for infections with a predominantly sexual mode of transmission: Secondary | ICD-10-CM

## 2023-10-06 DIAGNOSIS — E049 Nontoxic goiter, unspecified: Secondary | ICD-10-CM | POA: Insufficient documentation

## 2023-10-06 LAB — POCT URINALYSIS DIPSTICK
Bilirubin, UA: NEGATIVE
Blood, UA: NEGATIVE
Glucose, UA: NEGATIVE
Ketones, UA: NEGATIVE
Leukocytes, UA: NEGATIVE
Nitrite, UA: NEGATIVE
Protein, UA: NEGATIVE
Spec Grav, UA: 1.01 (ref 1.010–1.025)
pH, UA: 6 (ref 5.0–8.0)

## 2023-10-06 MED ORDER — NORETHINDRONE 0.35 MG PO TABS: 1.0000 | ORAL_TABLET | Freq: Every day | ORAL | 2 refills | Status: DC

## 2023-10-06 NOTE — Patient Instructions (Signed)
 I value your feedback and you entrusting [place] with your care. If you get a [place] patient survey, I would appreciate you taking the time to let us know about your experience today. Thank you! ? ? ?

## 2023-10-08 LAB — CHLAMYDIA/GONOCOCCUS/TRICHOMONAS, NAA
Chlamydia by NAA: NEGATIVE
Gonococcus by NAA: NEGATIVE
Trich vag by NAA: NEGATIVE

## 2023-10-25 LAB — VISTASEQ HERED. CANCER PANEL: Result Summary: NEGATIVE

## 2023-10-25 LAB — TSH+FREE T4
Free T4: 1.31 ng/dL (ref 0.82–1.77)
TSH: 0.566 u[IU]/mL (ref 0.450–4.500)

## 2023-10-26 ENCOUNTER — Encounter: Payer: Self-pay | Admitting: Obstetrics and Gynecology

## 2023-11-04 ENCOUNTER — Encounter: Payer: Self-pay | Admitting: Nurse Practitioner

## 2023-11-05 ENCOUNTER — Other Ambulatory Visit: Payer: Self-pay | Admitting: Nurse Practitioner

## 2023-11-05 DIAGNOSIS — D500 Iron deficiency anemia secondary to blood loss (chronic): Secondary | ICD-10-CM

## 2023-11-05 DIAGNOSIS — E538 Deficiency of other specified B group vitamins: Secondary | ICD-10-CM

## 2023-11-13 ENCOUNTER — Telehealth: Payer: Self-pay | Admitting: Nurse Practitioner

## 2023-11-13 NOTE — Telephone Encounter (Signed)
 Tammy Weber scheduled her lab appointment.

## 2023-11-17 ENCOUNTER — Inpatient Hospital Stay: Attending: Nurse Practitioner

## 2023-11-17 DIAGNOSIS — E538 Deficiency of other specified B group vitamins: Secondary | ICD-10-CM | POA: Diagnosis not present

## 2023-11-17 DIAGNOSIS — D509 Iron deficiency anemia, unspecified: Secondary | ICD-10-CM | POA: Insufficient documentation

## 2023-11-17 DIAGNOSIS — D500 Iron deficiency anemia secondary to blood loss (chronic): Secondary | ICD-10-CM

## 2023-11-17 DIAGNOSIS — D751 Secondary polycythemia: Secondary | ICD-10-CM | POA: Insufficient documentation

## 2023-11-17 DIAGNOSIS — G08 Intracranial and intraspinal phlebitis and thrombophlebitis: Secondary | ICD-10-CM | POA: Diagnosis present

## 2023-11-17 DIAGNOSIS — N920 Excessive and frequent menstruation with regular cycle: Secondary | ICD-10-CM | POA: Diagnosis not present

## 2023-11-17 LAB — CBC WITH DIFFERENTIAL (CANCER CENTER ONLY)
Abs Immature Granulocytes: 0.04 10*3/uL (ref 0.00–0.07)
Basophils Absolute: 0 10*3/uL (ref 0.0–0.1)
Basophils Relative: 0 %
Eosinophils Absolute: 0.1 10*3/uL (ref 0.0–0.5)
Eosinophils Relative: 1 %
HCT: 40.5 % (ref 36.0–46.0)
Hemoglobin: 13.5 g/dL (ref 12.0–15.0)
Immature Granulocytes: 0 %
Lymphocytes Relative: 15 %
Lymphs Abs: 1.7 10*3/uL (ref 0.7–4.0)
MCH: 28.7 pg (ref 26.0–34.0)
MCHC: 33.3 g/dL (ref 30.0–36.0)
MCV: 86.2 fL (ref 80.0–100.0)
Monocytes Absolute: 1.1 10*3/uL — ABNORMAL HIGH (ref 0.1–1.0)
Monocytes Relative: 10 %
Neutro Abs: 8.4 10*3/uL — ABNORMAL HIGH (ref 1.7–7.7)
Neutrophils Relative %: 74 %
Platelet Count: 191 10*3/uL (ref 150–400)
RBC: 4.7 MIL/uL (ref 3.87–5.11)
RDW: 13.5 % (ref 11.5–15.5)
WBC Count: 11.3 10*3/uL — ABNORMAL HIGH (ref 4.0–10.5)
nRBC: 0 % (ref 0.0–0.2)

## 2023-11-17 LAB — IRON AND IRON BINDING CAPACITY (CC-WL,HP ONLY)
Iron: 36 ug/dL (ref 28–170)
Saturation Ratios: 9 % — ABNORMAL LOW (ref 10.4–31.8)
TIBC: 384 ug/dL (ref 250–450)
UIBC: 348 ug/dL (ref 148–442)

## 2023-11-17 LAB — FERRITIN: Ferritin: 22 ng/mL (ref 11–307)

## 2023-11-17 LAB — VITAMIN B12: Vitamin B-12: 358 pg/mL (ref 180–914)

## 2023-11-18 ENCOUNTER — Ambulatory Visit: Payer: Self-pay | Admitting: Nurse Practitioner

## 2024-01-13 ENCOUNTER — Other Ambulatory Visit: Payer: Self-pay

## 2024-01-13 DIAGNOSIS — E538 Deficiency of other specified B group vitamins: Secondary | ICD-10-CM

## 2024-01-13 DIAGNOSIS — D500 Iron deficiency anemia secondary to blood loss (chronic): Secondary | ICD-10-CM

## 2024-01-13 DIAGNOSIS — D519 Vitamin B12 deficiency anemia, unspecified: Secondary | ICD-10-CM

## 2024-01-13 NOTE — Assessment & Plan Note (Addendum)
 Likely provoked  -presented to ED on 03/27/21 with headache. CT head venogram confirmed right transverse and sigmoid sinus thrombosis. She was discharged with 5 days of Lovenox  then oral Pradaxa . Birth control pills were discontinued at that time. -returned 03/29/21 with worsening headache with muffled hearing. Brain MRI showed mild progression of CVST. -she received heparin  infusion while in the hospital. She was discharged on Eliquis , tolerated well -she had negative factor V Leiden mutation, prothrombin gene mutation, beta-2 glycoprotein and cardiolipin antibodies -repeat brain MRI/MRV on 07-29-21 showed improved flow signal. Repeat on 12/23/21 showed no change from 07/2021, with chronic thrombosis.  -Dr. [name] stopped Eliquis  and changed her to baby aspirin  81 mg at the end of 12/2021. Repeated hypercoagulable panel 04/16/22 was negative except elevated homocysteine level (19.5), which normalized 07/2022 -She developed near syncope in 03/2022, work up showed elevated hgb 16.2, stat MRV 04/25/22 was stable. This has resolved. -Continue baby aspirin  and risk reducing behaviors. She avoids estrogen.  She is taking progesterone only pills for OCP.  She is trying to quit smoking.  -F/up in 1 year, or sooner if needed.

## 2024-01-13 NOTE — Progress Notes (Signed)
 Patient Care Team: Pcp, No as PCP - General  Clinic Day:  01/14/2024  Referring physician: No ref. provider found  ASSESSMENT & PLAN:   Assessment & Plan: Cerebral venous thrombosis Likely provoked  -presented to ED on 03/27/21 with headache. CT head venogram confirmed right transverse and sigmoid sinus thrombosis. She was discharged with 5 days of Lovenox  then oral Pradaxa . Birth control pills were discontinued at that time. -returned 03/29/21 with worsening headache with muffled hearing. Brain MRI showed mild progression of CVST. -she received heparin  infusion while in the hospital. She was discharged on Eliquis , tolerated well -she had negative factor V Leiden mutation, prothrombin gene mutation, beta-2 glycoprotein and cardiolipin antibodies -repeat brain MRI/MRV on 07/10/2021 showed improved flow signal. Repeat on 12/23/21 showed no change from 07/2021, with chronic thrombosis.  -Dr. [name] stopped Eliquis  and changed her to baby aspirin  81 mg at the end of 12/2021. Repeated hypercoagulable panel 04/16/22 was negative except elevated homocysteine level (19.5), which normalized 07/2022 -She developed near syncope in 03/2022, work up showed elevated hgb 16.2, stat MRV 04/25/22 was stable. This has resolved. -Continue baby aspirin  and risk reducing behaviors. She avoids estrogen.  She is taking progesterone only pills for OCP.  She is trying to quit smoking.  -F/up in 1 year, or sooner if needed.      Iron  deficiency anemia Patient having sample Hgb at 13.8 and HCT 43.1.  Ferritin low/normal at 12.  Iron  panel unremarkable other than sat ratio of 11%.  Will recommend oral iron  supplement along with folic acid  every day.  Advised to take with orange juice/vitamin C to help with absorption.  Recheck in 1 year, sooner if needed.  History of cerebral venous thrombosis Initially treated with 10 months uninterrupted Eliquis .  Now taking baby aspirin  and folic acid  daily.  No new CT or MRI scan of brain  performed by neurology.  Patient interested in referral to neurology for continued treatment.  Advised her would look into this.  I do recommend she continue with current follow-up regimen as scheduled.  She voiced understanding and agreement.  Plan Reviewed labs. -CBC is unremarkable. - Iron  studies are unremarkable except saturation ratio at 11%.  Ferritin is low/normal at 12.  B12 normal at 416. - Encourage oral iron  supplementation along with orange juice or vitamin C to help with absorption of iron . - Continue baby aspirin  every day. - Will look into referral for new neurologist for continued evaluation of cerebral venous thrombosis.  Patient will keep current follow-ups as scheduled. - Labs and follow-up in 1 year, sooner if needed.  The patient understands the plans discussed today and is in agreement with them.  She knows to contact our office if she develops concerns prior to her next appointment.  I provided 25 minutes of face-to-face time during this encounter and > 50% was spent counseling as documented under my assessment and plan.    [name]  [name], NP  [facility] - A DEPT OF [name] [address] [place] [address] Dept: [phone] Dept Fax: [phone]   Orders Placed This Encounter  Procedures   Ambulatory referral to Neurology    Referral Priority:   Routine    Referral Type:   Consultation    Referral Reason:   Specialty Services Required    Requested Specialty:   Neurology    Number of Visits Requested:   1      CHIEF  COMPLAINT:  CC: Cerebral venous thrombosis  Current Treatment: Baby aspirin  and oral folic acid   INTERVAL HISTORY:  Tammy Weber is here today for repeat clinical assessment.  She was last seen by [name] on 01/14/2023.  She has history of cerebral venous thrombosis.  She holds her baby aspirin  during her menstrual cycles which are very heavy.  She is now taking a  progesterone only oral contraceptive.  She states this has helped with the heaviness of her menstrual period, though she does spot in between cycles.  She has noticed no new signs of abnormal bleeding including blood in stool, hemoptysis, epistaxis, or hematemesis.  She denies chest pain, chest pressure, or shortness of breath. She denies headaches or visual disturbances. She denies abdominal pain, nausea, vomiting, or changes in bowel or bladder habits.   She denies fevers or chills. She denies pain. Her appetite is good. Her weight has decreased 8 pounds over last 3 months.  I have reviewed the past medical history, past surgical history, social history and family history with the patient and they are unchanged from previous note.  ALLERGIES:  is allergic to latex.  MEDICATIONS:  Current Outpatient Medications  Medication Sig Dispense Refill   aspirin  EC 81 MG tablet Take 1 tablet (81 mg total) by mouth daily. Swallow whole. 30 tablet 12   folic acid  (FOLVITE ) 1 MG tablet Take by mouth.     norethindrone  (MICRONOR ) 0.35 MG tablet Take 1 tablet (0.35 mg total) by mouth daily. 84 tablet 2   No current facility-administered medications for this visit.      REVIEW OF SYSTEMS:   Constitutional: Denies fevers, chills or abnormal weight loss Eyes: Denies blurriness of vision Ears, nose, mouth, throat, and face: Denies mucositis or sore throat Respiratory: Denies cough, dyspnea or wheezes Cardiovascular: Denies palpitation, chest discomfort or lower extremity swelling Gastrointestinal:  Denies nausea, heartburn or change in bowel habits Skin: Denies abnormal skin rashes Lymphatics: Denies new lymphadenopathy or easy bruising Neurological:Denies numbness, tingling or new weaknesses Behavioral/Psych: Mood is stable, no new changes  All other systems were reviewed with the patient and are negative.   VITALS:   Today's Vitals   01/14/24 1015  BP: 120/68  Pulse: 71  Resp: 17  Temp: 98 F  (36.7 C)  SpO2: 99%  Weight: 138 lb 12.8 oz (63 kg)   Body mass index is 21.1 kg/m.   Wt Readings from Last 3 Encounters:  01/14/24 138 lb 12.8 oz (63 kg)  10/06/23 146 lb (66.2 kg)  05/06/23 155 lb (70.3 kg)    Body mass index is 21.1 kg/m.  Performance status (ECOG): 1 - Symptomatic but completely ambulatory  PHYSICAL EXAM:   GENERAL:alert, no distress and comfortable SKIN: skin color, texture, turgor are normal, no rashes or significant lesions EYES: normal, Conjunctiva are pink and non-injected, sclera clear OROPHARYNX:no exudate, no erythema and lips, buccal mucosa, and tongue normal  NECK: supple, thyroid  normal size, non-tender, without nodularity LYMPH:  no palpable lymphadenopathy in the cervical, axillary or inguinal LUNGS: clear to auscultation and percussion with normal breathing effort HEART: regular rate & rhythm and no murmurs and no lower extremity edema ABDOMEN:abdomen soft, non-tender and normal bowel sounds Musculoskeletal:no cyanosis of digits and no clubbing  NEURO: alert & oriented x 3 with fluent speech, no focal motor/sensory deficits  LABORATORY DATA:  I have reviewed the data as listed    Component Value Date/Time   NA 134 (L) 05/06/2023 1531   K 4.0 05/06/2023 1531  CL 102 05/06/2023 1531   CO2 24 05/06/2023 1531   GLUCOSE 115 (H) 05/06/2023 1531   BUN 11 05/06/2023 1531   CREATININE 0.85 05/06/2023 1531   CALCIUM 9.1 05/06/2023 1531   PROT 7.5 03/29/2021 1724   ALBUMIN 3.9 03/29/2021 1724   AST 16 03/29/2021 1724   ALT 12 03/29/2021 1724   ALKPHOS 58 03/29/2021 1724   BILITOT 0.2 (L) 03/29/2021 1724   GFRNONAA >60 05/06/2023 1531     Lab Results  Component Value Date   WBC 6.1 01/14/2024   NEUTROABS 3.8 01/14/2024   HGB 13.8 01/14/2024   HCT 42.1 01/14/2024   MCV 86.3 01/14/2024   PLT 263 01/14/2024

## 2024-01-14 ENCOUNTER — Inpatient Hospital Stay: Payer: Managed Care, Other (non HMO) | Admitting: Nurse Practitioner

## 2024-01-14 ENCOUNTER — Inpatient Hospital Stay: Payer: Managed Care, Other (non HMO) | Attending: Nurse Practitioner

## 2024-01-14 VITALS — BP 120/68 | HR 71 | Temp 98.0°F | Resp 17 | Wt 138.8 lb

## 2024-01-14 DIAGNOSIS — G08 Intracranial and intraspinal phlebitis and thrombophlebitis: Secondary | ICD-10-CM

## 2024-01-14 DIAGNOSIS — D500 Iron deficiency anemia secondary to blood loss (chronic): Secondary | ICD-10-CM

## 2024-01-14 DIAGNOSIS — Z86718 Personal history of other venous thrombosis and embolism: Secondary | ICD-10-CM | POA: Insufficient documentation

## 2024-01-14 DIAGNOSIS — D519 Vitamin B12 deficiency anemia, unspecified: Secondary | ICD-10-CM

## 2024-01-14 DIAGNOSIS — D509 Iron deficiency anemia, unspecified: Secondary | ICD-10-CM | POA: Diagnosis not present

## 2024-01-14 DIAGNOSIS — Z7982 Long term (current) use of aspirin: Secondary | ICD-10-CM | POA: Insufficient documentation

## 2024-01-14 DIAGNOSIS — E538 Deficiency of other specified B group vitamins: Secondary | ICD-10-CM

## 2024-01-14 LAB — CBC WITH DIFFERENTIAL (CANCER CENTER ONLY)
Abs Immature Granulocytes: 0.02 K/uL (ref 0.00–0.07)
Basophils Absolute: 0 K/uL (ref 0.0–0.1)
Basophils Relative: 1 %
Eosinophils Absolute: 0 K/uL (ref 0.0–0.5)
Eosinophils Relative: 1 %
HCT: 42.1 % (ref 36.0–46.0)
Hemoglobin: 13.8 g/dL (ref 12.0–15.0)
Immature Granulocytes: 0 %
Lymphocytes Relative: 30 %
Lymphs Abs: 1.8 K/uL (ref 0.7–4.0)
MCH: 28.3 pg (ref 26.0–34.0)
MCHC: 32.8 g/dL (ref 30.0–36.0)
MCV: 86.3 fL (ref 80.0–100.0)
Monocytes Absolute: 0.4 K/uL (ref 0.1–1.0)
Monocytes Relative: 6 %
Neutro Abs: 3.8 K/uL (ref 1.7–7.7)
Neutrophils Relative %: 62 %
Platelet Count: 263 K/uL (ref 150–400)
RBC: 4.88 MIL/uL (ref 3.87–5.11)
RDW: 13.5 % (ref 11.5–15.5)
WBC Count: 6.1 K/uL (ref 4.0–10.5)
nRBC: 0 % (ref 0.0–0.2)

## 2024-01-14 LAB — IRON AND IRON BINDING CAPACITY (CC-WL,HP ONLY)
Iron: 46 ug/dL (ref 28–170)
Saturation Ratios: 11 % (ref 10.4–31.8)
TIBC: 420 ug/dL (ref 250–450)
UIBC: 374 ug/dL (ref 148–442)

## 2024-01-14 LAB — VITAMIN B12: Vitamin B-12: 416 pg/mL (ref 180–914)

## 2024-01-14 LAB — FERRITIN: Ferritin: 12 ng/mL (ref 11–307)

## 2024-01-18 ENCOUNTER — Encounter: Payer: Self-pay | Admitting: Nurse Practitioner

## 2024-01-18 ENCOUNTER — Encounter: Payer: Self-pay | Admitting: Hematology

## 2024-01-19 ENCOUNTER — Telehealth: Payer: Self-pay | Admitting: Nurse Practitioner

## 2024-01-19 NOTE — Telephone Encounter (Signed)
 Scheduled appointments per 7/16 los. Called and left a VM with appointment details for the patient.

## 2024-01-20 ENCOUNTER — Ambulatory Visit: Payer: Self-pay | Admitting: Nurse Practitioner

## 2024-02-11 ENCOUNTER — Encounter: Payer: Self-pay | Admitting: Obstetrics and Gynecology

## 2024-02-11 ENCOUNTER — Encounter: Payer: Self-pay | Admitting: Nurse Practitioner

## 2024-02-12 ENCOUNTER — Other Ambulatory Visit: Payer: Self-pay | Admitting: Nurse Practitioner

## 2024-02-12 DIAGNOSIS — D500 Iron deficiency anemia secondary to blood loss (chronic): Secondary | ICD-10-CM

## 2024-02-12 DIAGNOSIS — E538 Deficiency of other specified B group vitamins: Secondary | ICD-10-CM

## 2024-02-18 NOTE — Progress Notes (Unsigned)
    Pcp, No   No chief complaint on file.   HPI:      [name] is a 23 y.o. G1P0010 whose LMP was No LMP recorded., presents today for ***    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   Anemia 03/29/2021   Hypertriglyceridemia 07/14/2019    Past Surgical History:  Procedure Laterality Date   NO PAST SURGERIES      Family History  Problem Relation Age of Onset   Colon cancer Paternal Aunt    Pancreatic cancer Paternal Grandmother    Ovarian cancer Other     Social History   Socioeconomic History   Marital status: Single    Spouse name: Not on file   Number of children: Not on file   Years of education: Not on file   Highest education level: Not on file  Occupational History   Not on file  Tobacco Use   Smoking status: Some Days    Types: Cigarettes   Smokeless tobacco: Never  Vaping Use   Vaping status: Former  Substance and Sexual Activity   Alcohol use: Yes   Drug use: No   Sexual activity: Yes    Birth control/protection: Pill  Other Topics Concern   Not on file  Social History Narrative   Lives on campus at [facility]   Right Handed   Drinks caffeine rarely   Social Drivers of Health   Financial Resource Strain: Not on file  Food Insecurity: No Food Insecurity (01/14/2024)   Hunger Vital Sign    Worried About Running Out of Food in the Last Year: Never true    Ran Out of Food in the Last Year: Never true  Transportation Needs: No Transportation Needs (01/14/2024)   PRAPARE - Administrator, Civil Service (Medical): No    Lack of Transportation (Non-Medical): No  Physical Activity: Not on file  Stress: Not on file  Social Connections: Not on file   Intimate Partner Violence: Not At Risk (01/14/2024)   Humiliation, Afraid, Rape, and Kick questionnaire    Fear of Current or Ex-Partner: No    Emotionally Abused: No    Physically Abused: No    Sexually Abused: No    Outpatient Medications Prior to Visit  Medication Sig Dispense Refill   aspirin  EC 81 MG tablet Take 1 tablet (81 mg total) by mouth daily. Swallow whole. 30 tablet 12   folic acid  (FOLVITE ) 1 MG tablet Take by mouth.     norethindrone  (MICRONOR ) 0.35 MG tablet Take 1 tablet (0.35 mg total) by mouth daily. 84 tablet 2   No facility-administered medications prior to visit.      ROS:  Review of Systems BREAST: No symptoms   OBJECTIVE:   Vitals:  There were no vitals taken for this visit.  Physical Exam  Results: No results found for this or any previous visit (from the past 24 hours).   Assessment/Plan: No diagnosis found.    No orders of the defined types were placed in this encounter.     No follow-ups on file.  Dierre Crevier B. Santosh Petter, PA-C 02/18/2024 5:20 PM

## 2024-02-19 ENCOUNTER — Ambulatory Visit: Admitting: Obstetrics and Gynecology

## 2024-02-19 ENCOUNTER — Encounter: Payer: Self-pay | Admitting: Obstetrics and Gynecology

## 2024-02-19 VITALS — BP 110/68 | HR 81 | Ht 68.0 in | Wt 139.0 lb

## 2024-02-19 DIAGNOSIS — F419 Anxiety disorder, unspecified: Secondary | ICD-10-CM

## 2024-02-19 MED ORDER — HYDROXYZINE HCL 25 MG PO TABS: 25.0000 mg | ORAL_TABLET | Freq: Four times a day (QID) | ORAL | 1 refills | Status: AC | PRN

## 2024-02-19 MED ORDER — ESCITALOPRAM OXALATE 10 MG PO TABS: ORAL_TABLET | ORAL | 1 refills | Status: DC

## 2024-03-25 ENCOUNTER — Encounter: Payer: Self-pay | Admitting: Obstetrics and Gynecology

## 2024-03-25 ENCOUNTER — Other Ambulatory Visit: Payer: Self-pay | Admitting: Obstetrics and Gynecology

## 2024-03-25 DIAGNOSIS — F419 Anxiety disorder, unspecified: Secondary | ICD-10-CM

## 2024-03-25 MED ORDER — ESCITALOPRAM OXALATE 10 MG PO TABS: 10.0000 mg | ORAL_TABLET | Freq: Every day | ORAL | 0 refills | Status: DC

## 2024-03-25 NOTE — Progress Notes (Signed)
 Rx lexapro  to mail order, has 7 wk f/u in a few wks

## 2024-04-06 ENCOUNTER — Ambulatory Visit: Admitting: Obstetrics and Gynecology

## 2024-04-06 ENCOUNTER — Encounter: Payer: Self-pay | Admitting: Obstetrics and Gynecology

## 2024-04-06 VITALS — BP 107/73 | HR 78 | Ht 68.0 in | Wt 139.0 lb

## 2024-04-06 DIAGNOSIS — F419 Anxiety disorder, unspecified: Secondary | ICD-10-CM | POA: Diagnosis not present

## 2024-04-06 MED ORDER — ESCITALOPRAM OXALATE 10 MG PO TABS
10.0000 mg | ORAL_TABLET | Freq: Every day | ORAL | 2 refills | Status: AC
Start: 2024-04-06 — End: ?

## 2024-04-06 NOTE — Progress Notes (Signed)
 Pcp, No   Chief Complaint  Patient presents with   Follow-up    Anx/Dep    HPI:      [name] is a 23 y.o. G1P0010 whose LMP was Patient's last menstrual period was 03/04/2024 (exact date)., presents today for anxiety/depression f/u from 8/25. Started lexapro  10 mg and hydroxyzine  prn; doing really well. No side effects except night sweats. Dizziness and pre-syncopal episodes resolved. Wants to cont.  02/19/24 NOTE: presents today for anxiety, increased recently the past 2 wks. Long hx of anxiety but has always managed it, no meds ever used to tx. Has had such severe anxiety to the point of feeling dizzy and like she is going to pass out recently. No tachy/SOB/panic attack sx. Able to sleep at night, is exercising. Not taking Vit D supp regularly. Work is a stressor for her. Also unable to eat a full meal due to anorexia with anxiety. FH anxiety in her sister. No SI.   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   Anemia 03/29/2021   Hypertriglyceridemia 07/14/2019    Past Surgical History:  Procedure Laterality Date   NO PAST SURGERIES      Family History  Problem Relation Age of Onset   Colon cancer Paternal Aunt    Pancreatic cancer Paternal Grandmother    Ovarian cancer Other     Social History   Socioeconomic History   Marital status: Single    Spouse name: Not on file   Number of children: Not on file   Years of education: Not on file   Highest education level: Not on file  Occupational History   Not on file  Tobacco Use   Smoking status: Some Days    Types: Cigarettes   Smokeless tobacco: Never  Vaping Use   Vaping status: Former  Substance and Sexual Activity    Alcohol use: Yes    Comment: soc   Drug use: No   Sexual activity: Yes    Birth control/protection: Pill  Other Topics Concern   Not on file  Social History Narrative   Lives on campus [facility]   Right Handed   Drinks caffeine rarely   Social Drivers of Health   Financial Resource Strain: Not on file  Food Insecurity: No Food Insecurity (01/14/2024)   Hunger Vital Sign    Worried About Running Out of Food in the Last Year: Never true    Ran Out of Food in the Last Year: Never true  Transportation Needs: No Transportation Needs (01/14/2024)   PRAPARE - Administrator, Civil Service (Medical): No    Lack of Transportation (Non-Medical): No  Physical Activity: Not on file  Stress: Not on file  Social Connections: Not on file  Intimate Partner Violence: Not At Risk (01/14/2024)   Humiliation, Afraid, Rape, and Kick questionnaire    Fear of Current or Ex-Partner: No    Emotionally Abused: No    Physically Abused: No    Sexually Abused: No    Outpatient Medications Prior to Visit  Medication Sig Dispense Refill   aspirin  EC 81 MG tablet Take 1 tablet (81 mg total) by mouth daily. Swallow whole. 30 tablet 12  folic acid  (FOLVITE ) 1 MG tablet Take by mouth.     hydrOXYzine  (ATARAX ) 25 MG tablet Take 1 tablet (25 mg total) by mouth every 6 (six) hours as needed for anxiety. 30 tablet 1   norethindrone  (MICRONOR ) 0.35 MG tablet Take 1 tablet (0.35 mg total) by mouth daily. 84 tablet 2   escitalopram  (LEXAPRO ) 10 MG tablet Take 1 tablet (10 mg total) by mouth daily. 90 tablet 0   No facility-administered medications prior to visit.      ROS:  Review of Systems  Constitutional:  Negative for fever.  Gastrointestinal:  Negative for blood in stool, constipation, diarrhea, nausea and vomiting.  Genitourinary:  Negative for dyspareunia, dysuria, flank pain, frequency, hematuria, urgency, vaginal bleeding, vaginal discharge and vaginal pain.  Musculoskeletal:  Negative for  back pain.  Skin:  Negative for rash.   BREAST: No symptoms   OBJECTIVE:   Vitals:  BP 107/73   Pulse 78   Ht 5' 8 (1.727 m)   Wt 139 lb (63 kg)   LMP 03/04/2024 (Exact Date)   BMI 21.13 kg/m   Physical Exam Vitals reviewed.  Constitutional:      Appearance: She is well-developed.  Pulmonary:     Effort: Pulmonary effort is normal.  Musculoskeletal:        General: Normal range of motion.     Cervical back: Normal range of motion.  Skin:    General: Skin is warm and dry.  Neurological:     General: No focal deficit present.     Mental Status: She is alert and oriented to person, place, and time.     Cranial Nerves: No cranial nerve deficit.  Psychiatric:        Mood and Affect: Mood normal.        Behavior: Behavior normal.        Thought Content: Thought content normal.        Judgment: Judgment normal.     Results: No results found for this or any previous visit (from the past 24 hours).    04/06/2024    2:45 PM 02/19/2024    3:06 PM  GAD 7 : Generalized Anxiety Score  Nervous, Anxious, on Edge 1 3  Control/stop worrying 0 3  Worry too much - different things 0 3  Trouble relaxing 0 3  Restless 0 2  Easily annoyed or irritable 1 1  Afraid - awful might happen 0 3  Total GAD 7 Score 2 18  Anxiety Difficulty Not difficult at all Very difficult       04/06/2024    2:45 PM  Depression screen PHQ 2/9  Decreased Interest 0  Down, Depressed, Hopeless 0  PHQ - 2 Score 0  Altered sleeping 1  Tired, decreased energy 0  Change in appetite 0  Feeling bad or failure about yourself  0  Trouble concentrating 2  Moving slowly or fidgety/restless 0  Suicidal thoughts 0  PHQ-9 Score 3  Difficult doing work/chores Somewhat difficult     Assessment/Plan: Anxiety - Plan: escitalopram  (LEXAPRO ) 10 MG tablet; doing well, Rx RF eRxd. F/u prn.   Meds ordered this encounter  Medications   escitalopram  (LEXAPRO ) 10 MG tablet    Sig: Take 1 tablet (10 mg total)  by mouth daily.    Dispense:  90 tablet    Refill:  2    Supervising Provider:   ROBY, MICIA [[id number]]      Return if symptoms worsen or fail to improve.  Coren Crownover B. Azlan Hanway, PA-C 04/06/2024 4:36 PM

## 2024-04-06 NOTE — Patient Instructions (Signed)
 I value your feedback and you entrusting [place] with your care. If you get a [place] patient survey, I would appreciate you taking the time to let us know about your experience today. Thank you! ? ? ?

## 2024-07-14 ENCOUNTER — Other Ambulatory Visit: Payer: Self-pay | Admitting: Obstetrics and Gynecology

## 2024-07-14 DIAGNOSIS — Z3041 Encounter for surveillance of contraceptive pills: Secondary | ICD-10-CM

## 2024-07-27 ENCOUNTER — Encounter: Payer: Self-pay | Admitting: Obstetrics and Gynecology

## 2024-07-27 ENCOUNTER — Other Ambulatory Visit: Payer: Self-pay | Admitting: Obstetrics and Gynecology

## 2024-07-27 DIAGNOSIS — Z3041 Encounter for surveillance of contraceptive pills: Secondary | ICD-10-CM

## 2024-08-05 ENCOUNTER — Telehealth: Payer: Self-pay | Admitting: Obstetrics and Gynecology

## 2024-08-05 DIAGNOSIS — Z3041 Encounter for surveillance of contraceptive pills: Secondary | ICD-10-CM

## 2024-08-05 MED ORDER — NORETHINDRONE 0.35 MG PO TABS: 1.0000 | ORAL_TABLET | Freq: Every day | ORAL | 0 refills | Status: AC

## 2024-08-05 NOTE — Telephone Encounter (Signed)
 Patient called back and asked for her RF to be sent to [facility] in [place], Rx RF sent. I also advised to call back in March to schedule Annual for April.

## 2024-08-05 NOTE — Telephone Encounter (Signed)
 Patient called this morning and she stated that she only has 2 1/2 weeks of birth control left.  Her annual is not due until 10/06/2023.  We don't have the schedule for any providers for the month of April.  Will you check to see if  bridge can be sent.  If so please remind the patient to call back to schedule her annual appointment.  Thanks, [employer]

## 2024-08-05 NOTE — Addendum Note (Signed)
 Addended by: Wandy Bossler on: 08/05/2024 01:53 PM   Modules accepted: Orders

## 2024-08-05 NOTE — Telephone Encounter (Signed)
 Called patient, no answer, LVM[facility]C. If patient calls back, please ask which pharmacy does she want [facility] RF sent to so we can send 1 84 tablet refill and advise to call back early/mid March to schedule her next Annual.

## 2025-01-13 ENCOUNTER — Other Ambulatory Visit

## 2025-01-13 ENCOUNTER — Ambulatory Visit: Admitting: Nurse Practitioner
